# Patient Record
Sex: Male | Born: 1955 | Race: White | Hispanic: No | Marital: Married | Smoking: Never smoker
Health system: Southern US, Community
[De-identification: ages and names within clinical notes are randomized; demographics above are authoritative.]

## PROBLEM LIST (undated history)

## (undated) DIAGNOSIS — R590 Localized enlarged lymph nodes: Secondary | ICD-10-CM

## (undated) DIAGNOSIS — J309 Allergic rhinitis, unspecified: Secondary | ICD-10-CM

## (undated) DIAGNOSIS — Z85820 Personal history of malignant melanoma of skin: Secondary | ICD-10-CM

## (undated) DIAGNOSIS — L309 Dermatitis, unspecified: Secondary | ICD-10-CM

## (undated) DIAGNOSIS — Z9889 Other specified postprocedural states: Secondary | ICD-10-CM

## (undated) DIAGNOSIS — C801 Malignant (primary) neoplasm, unspecified: Secondary | ICD-10-CM

## (undated) DIAGNOSIS — N1330 Unspecified hydronephrosis: Secondary | ICD-10-CM

## (undated) DIAGNOSIS — C8330 Diffuse large B-cell lymphoma, unspecified site: Secondary | ICD-10-CM

## (undated) HISTORY — DX: Diffuse large B-cell lymphoma, unspecified site: C83.30

## (undated) HISTORY — DX: Dermatitis, unspecified: L30.9

## (undated) HISTORY — DX: Malignant (primary) neoplasm, unspecified: C80.1

## (undated) HISTORY — DX: Allergic rhinitis, unspecified: J30.9

---

## 2000-11-07 ENCOUNTER — Other Ambulatory Visit: Admission: RE | Admit: 2000-11-07 | Discharge: 2000-11-07 | Payer: Self-pay | Admitting: Family Medicine

## 2001-02-21 ENCOUNTER — Ambulatory Visit (HOSPITAL_COMMUNITY): Admission: RE | Admit: 2001-02-21 | Discharge: 2001-02-21 | Payer: Self-pay | Admitting: Family Medicine

## 2001-02-21 ENCOUNTER — Encounter: Payer: Self-pay | Admitting: Family Medicine

## 2001-09-14 ENCOUNTER — Encounter: Payer: Self-pay | Admitting: Internal Medicine

## 2001-09-14 ENCOUNTER — Emergency Department (HOSPITAL_COMMUNITY): Admission: EM | Admit: 2001-09-14 | Discharge: 2001-09-14 | Payer: Self-pay | Admitting: Internal Medicine

## 2002-09-08 ENCOUNTER — Emergency Department (HOSPITAL_COMMUNITY): Admission: EM | Admit: 2002-09-08 | Discharge: 2002-09-08 | Payer: Self-pay | Admitting: Emergency Medicine

## 2002-09-08 ENCOUNTER — Encounter: Payer: Self-pay | Admitting: Emergency Medicine

## 2004-09-03 ENCOUNTER — Ambulatory Visit (HOSPITAL_COMMUNITY): Admission: RE | Admit: 2004-09-03 | Discharge: 2004-09-03 | Payer: Self-pay | Admitting: Family Medicine

## 2004-09-04 ENCOUNTER — Ambulatory Visit (HOSPITAL_COMMUNITY): Admission: RE | Admit: 2004-09-04 | Discharge: 2004-09-04 | Payer: Self-pay | Admitting: Family Medicine

## 2005-07-15 ENCOUNTER — Encounter: Admission: RE | Admit: 2005-07-15 | Discharge: 2005-07-15 | Payer: Self-pay | Admitting: Thoracic Surgery

## 2005-07-21 ENCOUNTER — Encounter: Admission: RE | Admit: 2005-07-21 | Discharge: 2005-07-21 | Payer: Self-pay | Admitting: Thoracic Surgery

## 2005-08-18 ENCOUNTER — Encounter: Admission: RE | Admit: 2005-08-18 | Discharge: 2005-08-18 | Payer: Self-pay | Admitting: Thoracic Surgery

## 2005-12-22 ENCOUNTER — Encounter: Admission: RE | Admit: 2005-12-22 | Discharge: 2005-12-22 | Payer: Self-pay | Admitting: Thoracic Surgery

## 2006-01-14 ENCOUNTER — Ambulatory Visit: Payer: Self-pay | Admitting: Cardiology

## 2006-05-25 ENCOUNTER — Ambulatory Visit (HOSPITAL_COMMUNITY): Admission: RE | Admit: 2006-05-25 | Discharge: 2006-05-25 | Payer: Self-pay | Admitting: Otolaryngology

## 2008-07-05 HISTORY — PX: COLONOSCOPY: SHX5424

## 2009-04-18 ENCOUNTER — Encounter: Payer: Self-pay | Admitting: Internal Medicine

## 2009-05-09 ENCOUNTER — Ambulatory Visit: Payer: Self-pay | Admitting: Internal Medicine

## 2009-05-09 ENCOUNTER — Ambulatory Visit (HOSPITAL_COMMUNITY): Admission: RE | Admit: 2009-05-09 | Discharge: 2009-05-09 | Payer: Self-pay | Admitting: Internal Medicine

## 2010-07-26 ENCOUNTER — Encounter: Payer: Self-pay | Admitting: Thoracic Surgery

## 2012-11-08 ENCOUNTER — Telehealth: Payer: Self-pay | Admitting: Family Medicine

## 2012-11-08 ENCOUNTER — Encounter: Payer: Self-pay | Admitting: *Deleted

## 2012-11-08 NOTE — Telephone Encounter (Signed)
Pt was seen in the last few months for a sinus infection and was given 2 prescriptions to clear up that infection then. He is having the same symptoms again and wants to know if he can get another script to help with this infection or does he need to come in for a visit. Please call into [facility] if possible or call to make an appt.

## 2012-11-08 NOTE — Telephone Encounter (Signed)
He will need to come in for an office visit. Typically one week call in antibiotics for a patient it's when we had just seen them fair he recently for the same illness and it did not clear up with the first medicine.

## 2012-11-08 NOTE — Telephone Encounter (Signed)
Appointment made for Nov 09, 2012 @ 8:30am with Dr. Brett Canales for Sinus issues

## 2012-11-08 NOTE — Telephone Encounter (Signed)
Patient notified and scheduled office visit. 

## 2012-11-09 ENCOUNTER — Encounter: Payer: Self-pay | Admitting: Family Medicine

## 2012-11-09 ENCOUNTER — Ambulatory Visit (INDEPENDENT_AMBULATORY_CARE_PROVIDER_SITE_OTHER): Payer: 59 | Admitting: Family Medicine

## 2012-11-09 VITALS — BP 114/80 | Temp 97.7°F | Wt 229.0 lb

## 2012-11-09 DIAGNOSIS — J322 Chronic ethmoidal sinusitis: Secondary | ICD-10-CM

## 2012-11-09 MED ORDER — DOXYCYCLINE HYCLATE 100 MG PO TABS: 100.0000 mg | ORAL_TABLET | Freq: Two times a day (BID) | ORAL | Status: DC

## 2012-11-09 MED ORDER — FLUTICASONE PROPIONATE 50 MCG/ACT NA SUSP: NASAL | Status: DC

## 2012-11-09 MED ORDER — CETIRIZINE HCL 10 MG PO TABS: 10.0000 mg | ORAL_TABLET | Freq: Every day | ORAL | Status: DC

## 2012-11-09 NOTE — Patient Instructions (Signed)
olecranon bursitis

## 2012-11-09 NOTE — Progress Notes (Signed)
  Subjective:    Patient ID: John Marquez, male    DOB: [birth year], 57 y.o.   MRN: [id number]  Sinusitis This is a new problem. The current episode started in the past 7 days. The problem has been gradually worsening since onset. There has been no fever. The fever has been present for less than 1 day. His pain is at a severity of 3/10. Associated symptoms include congestion, coughing, headaches, sinus pressure and sneezing. Past treatments include nothing. The treatment provided mild relief.    Patient a bit frustrated by frequency of sinusitis the past couple years. Has seasonal allergies. Uses Zyrtec and Flonase when necessary.  Review of Systems  HENT: Positive for congestion, sneezing and sinus pressure.   Respiratory: Positive for cough.   Neurological: Positive for headaches.       Objective:   Physical Exam Alert no acute distress. Vitals reviewed. HEENT moderate nasal congestion. Some maxillary tenderness. Pharynx slight erythema neck supple. Lungs clear. Heart regular in rhythm.       Assessment & Plan:  Impression acute sinusitis. #2 allergic rhinitis discussed. Plan antibiotics prescribed. Increase Flonase to 2 sprays each nostril daily. On the way out the door the patient point towards his elbow, and asked what was going on, and I advised him he has an element of olecranon bursitis.

## 2013-04-30 ENCOUNTER — Telehealth: Payer: Self-pay | Admitting: Family Medicine

## 2013-04-30 DIAGNOSIS — R7301 Impaired fasting glucose: Secondary | ICD-10-CM

## 2013-04-30 DIAGNOSIS — Z125 Encounter for screening for malignant neoplasm of prostate: Secondary | ICD-10-CM

## 2013-04-30 DIAGNOSIS — Z0000 Encounter for general adult medical examination without abnormal findings: Secondary | ICD-10-CM

## 2013-04-30 NOTE — Telephone Encounter (Signed)
Lipid/liv/met7/HgA1C/PSA

## 2013-04-30 NOTE — Telephone Encounter (Signed)
Blood work ordered in Epic. Patient notified. 

## 2013-04-30 NOTE — Telephone Encounter (Signed)
BW papers please

## 2013-05-01 LAB — LIPID PANEL
HDL: 52 mg/dL (ref >39)
Total CHOL/HDL Ratio: 2.7 Ratio
VLDL: 17 mg/dL (ref 0–40)

## 2013-05-01 LAB — BASIC METABOLIC PANEL
Creat: 1.01 mg/dL (ref 0.50–1.35)
Glucose, Bld: 101 mg/dL — ABNORMAL HIGH (ref 70–99)
Sodium: 139 mEq/L (ref 135–145)

## 2013-05-01 LAB — HEPATIC FUNCTION PANEL
AST: 24 U/L (ref 0–37)
Albumin: 4.5 g/dL (ref 3.5–5.2)
Alkaline Phosphatase: 60 U/L (ref 39–117)
Total Bilirubin: 0.6 mg/dL (ref 0.3–1.2)
Total Protein: 6.7 g/dL (ref 6.0–8.3)

## 2013-05-02 ENCOUNTER — Encounter: Payer: Self-pay | Admitting: Family Medicine

## 2013-05-14 ENCOUNTER — Ambulatory Visit (INDEPENDENT_AMBULATORY_CARE_PROVIDER_SITE_OTHER): Payer: 59 | Admitting: Family Medicine

## 2013-05-14 ENCOUNTER — Encounter: Payer: Self-pay | Admitting: Family Medicine

## 2013-05-14 DIAGNOSIS — Z0000 Encounter for general adult medical examination without abnormal findings: Secondary | ICD-10-CM

## 2013-05-14 DIAGNOSIS — Z23 Encounter for immunization: Secondary | ICD-10-CM

## 2013-05-14 DIAGNOSIS — R7303 Prediabetes: Secondary | ICD-10-CM | POA: Insufficient documentation

## 2013-05-14 DIAGNOSIS — R7309 Other abnormal glucose: Secondary | ICD-10-CM

## 2013-05-14 NOTE — Progress Notes (Signed)
  Subjective:    Patient ID: John Marquez, male    DOB: 1955-11-01, 57 y.o.   MRN: [id number]  HPIHere for physical. Declined flu vaccine.  Patient denies any chest tightness pressure pain shortness breath he does try to be safe he is trying to stay physically active he denies any other particular problems   Review of Systems  Constitutional: Negative for fever, activity change and appetite change.  HENT: Negative for congestion and rhinorrhea.   Eyes: Negative for discharge.  Respiratory: Negative for cough and wheezing.   Cardiovascular: Negative for chest pain.  Gastrointestinal: Negative for vomiting, abdominal pain and blood in stool.  Genitourinary: Negative for frequency and difficulty urinating.  Musculoskeletal: Negative for neck pain.  Skin: Negative for rash.  Allergic/Immunologic: Negative for environmental allergies and food allergies.  Neurological: Negative for weakness and headaches.  Psychiatric/Behavioral: Negative for agitation.       Objective:   Physical Exam  Constitutional: He appears well-developed and well-nourished.  HENT:  Head: Normocephalic and atraumatic.  Right Ear: External ear normal.  Left Ear: External ear normal.  Nose: Nose normal.  Mouth/Throat: Oropharynx is clear and moist.  Eyes: EOM are normal. Pupils are equal, round, and reactive to light.  Neck: Normal range of motion. Neck supple. No thyromegaly present.  Cardiovascular: Normal rate, regular rhythm and normal heart sounds.   No murmur heard. Pulmonary/Chest: Effort normal and breath sounds normal. No respiratory distress. He has no wheezes.  Abdominal: Soft. Bowel sounds are normal. He exhibits no distension and no mass. There is no tenderness.  Genitourinary: Penis normal.  Musculoskeletal: Normal range of motion. He exhibits no edema.  Lymphadenopathy:    He has no cervical adenopathy.  Neurological: He is alert. He exhibits normal muscle tone.  Skin: Skin is warm and dry. No  erythema.  Psychiatric: He has a normal mood and affect. His behavior is normal. Judgment normal.          Assessment & Plan:  The patient comes in today for a wellness visit.  A review of their health history was completed.  A review of medications was also completed. Any necessary refills were discussed. Sensible healthy diet was discussed. Importance of minimizing excessive salt and carbohydrates was also discussed. Safety was stressed including driving, activities at work and at home where applicable. Importance of regular physical activity for overall health was discussed. Preventative measures appropriate for age were discussed. Time was spent with the patient discussing any concerns they have about their well-being. The importance of minimizing starches were discussed in detail. Safety measures also discussed. Patient will followup in one years time I did offer to check hemoglobin A1c through our office in 6 months  Lab work was reviewed the patient in detail. Does show some prediabetes but otherwise it is doing very well.

## 2013-11-13 ENCOUNTER — Ambulatory Visit (INDEPENDENT_AMBULATORY_CARE_PROVIDER_SITE_OTHER): Payer: 59 | Admitting: Family Medicine

## 2013-11-13 ENCOUNTER — Encounter: Payer: Self-pay | Admitting: Family Medicine

## 2013-11-13 VITALS — BP 118/78 | Temp 98.2°F | Ht 73.0 in | Wt 233.0 lb

## 2013-11-13 DIAGNOSIS — J019 Acute sinusitis, unspecified: Secondary | ICD-10-CM

## 2013-11-13 MED ORDER — CEFPROZIL 500 MG PO TABS: 500.0000 mg | ORAL_TABLET | Freq: Two times a day (BID) | ORAL | Status: DC

## 2013-11-13 MED ORDER — CETIRIZINE HCL 10 MG PO TABS: 10.0000 mg | ORAL_TABLET | Freq: Every day | ORAL | Status: DC

## 2013-11-13 MED ORDER — FLUTICASONE PROPIONATE 50 MCG/ACT NA SUSP: NASAL | Status: DC

## 2013-11-13 NOTE — Progress Notes (Signed)
   Subjective:    Patient ID: John Marquez, male    DOB: [birth year], 58 y.o.   MRN: [id number]  Cough This is a new problem. The current episode started in the past 7 days. Associated symptoms include nasal congestion, rhinorrhea and a sore throat. Pertinent negatives include no chest pain, ear pain, fever, shortness of breath or wheezing. Treatments tried: flonase, allergy med. The treatment provided no relief.   Throat pain first then cough Coughs with deep breath   Review of Systems  Constitutional: Negative for fever and activity change.  HENT: Positive for congestion, rhinorrhea and sore throat. Negative for ear pain.   Eyes: Negative for discharge.  Respiratory: Positive for cough. Negative for shortness of breath and wheezing.   Cardiovascular: Negative for chest pain.  Gastrointestinal: Negative for abdominal pain.       Objective:   Physical Exam  Nursing note and vitals reviewed. Constitutional: He appears well-developed.  HENT:  Head: Normocephalic.  Mouth/Throat: Oropharynx is clear and moist. No oropharyngeal exudate.  Neck: Normal range of motion.  Cardiovascular: Normal rate, regular rhythm and normal heart sounds.   No murmur heard. Pulmonary/Chest: Effort normal and breath sounds normal. He has no wheezes.  Bronchial cough noted  Lymphadenopathy:    He has no cervical adenopathy.  Neurological: He exhibits normal muscle tone.  Skin: Skin is warm and dry.          Assessment & Plan:  1. Acute sinus infection Antibiotics prescribed Cefzil twice a day for 2 weeks patient has taken these before and tolerated them. He also has left elbow pain he'll be seen orthopedic specialist possible nerve involvement he will set this appointment up.

## 2013-11-16 ENCOUNTER — Telehealth: Payer: Self-pay | Admitting: Family Medicine

## 2013-11-16 MED ORDER — SCOPOLAMINE 1 MG/3DAYS TD PT72: 1.0000 | MEDICATED_PATCH | TRANSDERMAL | Status: DC

## 2013-11-16 NOTE — Telephone Encounter (Signed)
Scopolamine patches use one q3d as needed, can cause blurry vision and occasional difficulty urinating, if issues with this then stop med

## 2013-11-16 NOTE — Telephone Encounter (Signed)
Patient would like prescription for motion sickness patches called in. He is going on vacation next week fishing. [place]

## 2013-11-16 NOTE — Addendum Note (Signed)
Addended byCharolotte Capuchin D on: 11/16/2013 04:38 PM   Modules accepted: Orders

## 2013-11-16 NOTE — Telephone Encounter (Signed)
Patient notified and verbalized understanding. 

## 2013-11-22 ENCOUNTER — Telehealth: Payer: Self-pay | Admitting: Family Medicine

## 2013-11-22 MED ORDER — CLINDAMYCIN HCL 300 MG PO CAPS: 300.0000 mg | ORAL_CAPSULE | Freq: Three times a day (TID) | ORAL | Status: DC

## 2013-11-22 NOTE — Telephone Encounter (Signed)
Left message on voicemail notifying patient med sent in.  

## 2013-11-22 NOTE — Telephone Encounter (Signed)
Patient was seen 5/12 with sinus infection and finished ed up antibotics but still sick. Can you call in something else ,states he is leaving going on vacation tomorrow. Call into [place] [facility]

## 2013-11-22 NOTE — Telephone Encounter (Signed)
Clindamycin 300 , 1 tid 7 days, also take probiotic daily

## 2013-11-22 NOTE — Addendum Note (Signed)
Addended by: Jesusita Oka on: 11/22/2013 11:27 AM   Modules accepted: Orders

## 2013-11-22 NOTE — Telephone Encounter (Signed)
Was given cefzil 500 mg

## 2014-03-13 ENCOUNTER — Ambulatory Visit (INDEPENDENT_AMBULATORY_CARE_PROVIDER_SITE_OTHER): Payer: 59 | Admitting: Family Medicine

## 2014-03-13 ENCOUNTER — Encounter: Payer: Self-pay | Admitting: Family Medicine

## 2014-03-13 VITALS — BP 124/80 | Temp 98.2°F | Ht 73.0 in | Wt 229.0 lb

## 2014-03-13 DIAGNOSIS — J329 Chronic sinusitis, unspecified: Secondary | ICD-10-CM

## 2014-03-13 MED ORDER — CLINDAMYCIN HCL 300 MG PO CAPS: 300.0000 mg | ORAL_CAPSULE | Freq: Three times a day (TID) | ORAL | Status: DC

## 2014-03-13 NOTE — Progress Notes (Signed)
   Subjective:    Patient ID: John Marquez, male    DOB: June 21, 1956, 58 y.o.   MRN: [id number]  Cough This is a new problem. The current episode started in the past 7 days. Associated symptoms include headaches, nasal congestion and a sore throat. Associated symptoms comments: Sneezing, Runny nose. He has tried nothing for the symptoms.    Frontal heaache  Cough not that productive  achey no meds other thn zyrtec prn  Frontal sinusitis  Throat in the morn, first thing in morn    Review of Systems  HENT: Positive for sore throat.   Respiratory: Positive for cough.   Neurological: Positive for headaches.        Objective:   Physical Exam  Alert vital signs stable. Lungs clear. Heart regular in rhythm for him frontal tenderness maxillary tenderness neck supple      Assessment & Plan:  Impression sinusitis discussed plan symptomatic care discussed. Antibiotics prescribed. Warning signs discussed. WSL

## 2014-04-19 ENCOUNTER — Telehealth: Payer: Self-pay | Admitting: Family Medicine

## 2014-04-19 DIAGNOSIS — R7303 Prediabetes: Secondary | ICD-10-CM

## 2014-04-19 DIAGNOSIS — Z79899 Other long term (current) drug therapy: Secondary | ICD-10-CM

## 2014-04-19 DIAGNOSIS — E785 Hyperlipidemia, unspecified: Secondary | ICD-10-CM

## 2014-04-19 DIAGNOSIS — Z125 Encounter for screening for malignant neoplasm of prostate: Secondary | ICD-10-CM

## 2014-04-19 NOTE — Telephone Encounter (Signed)
Last labs 04/30/13 Lipid, liver, BMP, A1C, PSA

## 2014-04-19 NOTE — Telephone Encounter (Signed)
Patient needs order for blood work. °

## 2014-04-21 NOTE — Telephone Encounter (Signed)
Same labs? 

## 2014-04-22 NOTE — Telephone Encounter (Signed)
Blood work ordered in Epic. Patient notified. 

## 2014-04-24 LAB — HEPATIC FUNCTION PANEL
ALT: 33 U/L (ref 0–53)
AST: 25 U/L (ref 0–37)
Albumin: 4.2 g/dL (ref 3.5–5.2)
Alkaline Phosphatase: 53 U/L (ref 39–117)
BILIRUBIN INDIRECT: 0.5 mg/dL (ref 0.2–1.2)
BILIRUBIN TOTAL: 0.7 mg/dL (ref 0.2–1.2)
Bilirubin, Direct: 0.2 mg/dL (ref 0.0–0.3)
Total Protein: 6.6 g/dL (ref 6.0–8.3)

## 2014-04-24 LAB — LIPID PANEL
CHOL/HDL RATIO: 2.6 ratio
Cholesterol: 131 mg/dL (ref 0–200)
HDL: 50 mg/dL (ref >39)
LDL CALC: 71 mg/dL (ref 0–99)
TRIGLYCERIDES: 52 mg/dL (ref <150)
VLDL: 10 mg/dL (ref 0–40)

## 2014-04-24 LAB — BASIC METABOLIC PANEL
BUN: 17 mg/dL (ref 6–23)
CO2: 27 meq/L (ref 19–32)
CREATININE: 1.17 mg/dL (ref 0.50–1.35)
Calcium: 9.2 mg/dL (ref 8.4–10.5)
Chloride: 105 mEq/L (ref 96–112)
GLUCOSE: 85 mg/dL (ref 70–99)
Potassium: 4.8 mEq/L (ref 3.5–5.3)
Sodium: 139 mEq/L (ref 135–145)

## 2014-04-24 LAB — HEMOGLOBIN A1C
Hgb A1c MFr Bld: 5.7 % — ABNORMAL HIGH (ref <5.7)
MEAN PLASMA GLUCOSE: 117 mg/dL — AB (ref <117)

## 2014-04-25 LAB — PSA: PSA: 0.84 ng/mL (ref <=4.00)

## 2014-05-16 ENCOUNTER — Encounter: Payer: 59 | Admitting: Family Medicine

## 2014-05-22 ENCOUNTER — Encounter: Payer: Self-pay | Admitting: Family Medicine

## 2014-05-22 ENCOUNTER — Ambulatory Visit (INDEPENDENT_AMBULATORY_CARE_PROVIDER_SITE_OTHER): Payer: 59 | Admitting: Family Medicine

## 2014-05-22 VITALS — BP 118/70 | Ht 73.0 in | Wt 231.0 lb

## 2014-05-22 DIAGNOSIS — R7303 Prediabetes: Secondary | ICD-10-CM

## 2014-05-22 DIAGNOSIS — R7309 Other abnormal glucose: Secondary | ICD-10-CM

## 2014-05-22 DIAGNOSIS — Z23 Encounter for immunization: Secondary | ICD-10-CM

## 2014-05-22 NOTE — Progress Notes (Signed)
   Subjective:    Patient ID: John Marquez, male    DOB: [birth year], 58 y.o.   MRN: [id number]  Hyperglycemia Pertinent negatives include no abdominal pain, chest pain, congestion, coughing, fatigue or weakness.   Patient is here today to go over blood work results.   Patient is trying to eat better and exercise more for his blood sugar.  Long discussion held regarding prediabetes hyperlipidemia watching diet blood pressure No concerns.    Review of Systems  Constitutional: Negative for activity change, appetite change and fatigue.  HENT: Negative for congestion.   Respiratory: Negative for cough.   Cardiovascular: Negative for chest pain.  Gastrointestinal: Negative for abdominal pain.  Endocrine: Negative for polydipsia and polyphagia.  Neurological: Negative for weakness.  Psychiatric/Behavioral: Negative for confusion.       Objective:   Physical Exam  Constitutional: He appears well-nourished. No distress.  Cardiovascular: Normal rate, regular rhythm and normal heart sounds.   No murmur heard. Pulmonary/Chest: Effort normal and breath sounds normal. No respiratory distress.  Musculoskeletal: He exhibits no edema.  Lymphadenopathy:    He has no cervical adenopathy.  Neurological: He is alert.  Psychiatric: His behavior is normal.  Vitals reviewed.  Prostate exam normal       Assessment & Plan:  Labs were reviewed in detail. Prediabetes watch diet closely Healthy eating regular physical activity recommended Wellness exam next year Patient up-to-date on everything for this year

## 2014-11-20 ENCOUNTER — Ambulatory Visit (INDEPENDENT_AMBULATORY_CARE_PROVIDER_SITE_OTHER): Payer: 59 | Admitting: Family Medicine

## 2014-11-20 ENCOUNTER — Encounter: Payer: Self-pay | Admitting: Family Medicine

## 2014-11-20 VITALS — BP 120/80 | Temp 98.0°F | Ht 73.0 in | Wt 227.0 lb

## 2014-11-20 DIAGNOSIS — J301 Allergic rhinitis due to pollen: Secondary | ICD-10-CM | POA: Diagnosis not present

## 2014-11-20 DIAGNOSIS — T162XXA Foreign body in left ear, initial encounter: Secondary | ICD-10-CM | POA: Diagnosis not present

## 2014-11-20 MED ORDER — CETIRIZINE HCL 10 MG PO TABS: 10.0000 mg | ORAL_TABLET | Freq: Every day | ORAL | Status: DC

## 2014-11-20 MED ORDER — FLUTICASONE PROPIONATE 50 MCG/ACT NA SUSP: NASAL | Status: DC

## 2014-11-20 NOTE — Progress Notes (Signed)
   Subjective:    Patient ID: John Marquez, male    DOB: [birth year], 59 y.o.   MRN: [id number]  HPI Patient in today because he had his ears irrigated at a [facility] yesterday and he now has a piece of his hearing aid stuck in his left ear. No pain but pressure is noted.  Patient also needs refills on his meds while he is here.   Review of Systems     Objective:   Physical Exam  Lungs clear hearts regular eardrums there is a foreign body in the left ear. Right ear appears normal. It appears that the tip of one of his hearing aids came off in his ear     Assessment & Plan:  Allergic rhinitis renewal of medicines given  Foreign body left ear. Needs urgent referral to ENT to have this removed.  He will follow-up this fall for wellness

## 2014-11-21 ENCOUNTER — Ambulatory Visit (INDEPENDENT_AMBULATORY_CARE_PROVIDER_SITE_OTHER): Payer: 59 | Admitting: Otolaryngology

## 2014-11-21 DIAGNOSIS — T162XXA Foreign body in left ear, initial encounter: Secondary | ICD-10-CM

## 2015-02-14 LAB — PULMONARY FUNCTION TEST

## 2015-03-24 ENCOUNTER — Encounter: Payer: Self-pay | Admitting: Family Medicine

## 2015-03-24 ENCOUNTER — Ambulatory Visit (INDEPENDENT_AMBULATORY_CARE_PROVIDER_SITE_OTHER): Payer: 59 | Admitting: Family Medicine

## 2015-03-24 VITALS — BP 130/80 | Temp 98.2°F | Ht 73.0 in | Wt 230.0 lb

## 2015-03-24 DIAGNOSIS — H00036 Abscess of eyelid left eye, unspecified eyelid: Secondary | ICD-10-CM | POA: Diagnosis not present

## 2015-03-24 DIAGNOSIS — J301 Allergic rhinitis due to pollen: Secondary | ICD-10-CM | POA: Diagnosis not present

## 2015-03-24 MED ORDER — OLOPATADINE HCL 0.2 % OP SOLN: 1.0000 [drp] | Freq: Every evening | OPHTHALMIC | Status: DC | PRN

## 2015-03-24 MED ORDER — CEFPROZIL 500 MG PO TABS: 500.0000 mg | ORAL_TABLET | Freq: Two times a day (BID) | ORAL | Status: DC

## 2015-03-24 NOTE — Progress Notes (Signed)
   Subjective:    Patient ID: John Marquez, male    DOB: [birth year], 59 y.o.   MRN: [id number]  Eye Pain  The left eye is affected. This is a new problem. The current episode started in the past 7 days. The problem occurs constantly. The problem has been unchanged. There was no injury mechanism. The pain is moderate. Associated symptoms include blurred vision and eye redness. Associated symptoms comments: Light sensitivity . Treatments tried: cetirizine, flonase. The treatment provided no relief.   Patient states that he has no other concerns at this time.    Review of Systems  Eyes: Positive for blurred vision, pain and redness.   Patient denies vomiting diarrhea. He states that the left eye has some irritation to it no true double vision. Relates head congestion sinus issues and allergies.    Objective:   Physical Exam  Left eyelid is puffy slightly red some tenderness throat normal ears normal neck no masses lungs clear heart regular  The pupils are responsive to light red reflex is normal optic disc appearance normal does not appear to have any iritis or red eye    Assessment & Plan:  Mild preorbital cellulitis with a thickened allergies-antibiotics allergy medicine recommended. Follow-up if progressive troubles, if not improving over the next 3 days or getting worse recommend referral to ophthalmology

## 2015-03-25 ENCOUNTER — Other Ambulatory Visit: Payer: Self-pay

## 2015-03-25 MED ORDER — OLOPATADINE HCL 0.1 % OP SOLN: OPHTHALMIC | Status: DC

## 2015-04-21 ENCOUNTER — Telehealth: Payer: Self-pay | Admitting: Family Medicine

## 2015-04-21 DIAGNOSIS — R7303 Prediabetes: Secondary | ICD-10-CM

## 2015-04-21 DIAGNOSIS — Z79899 Other long term (current) drug therapy: Secondary | ICD-10-CM

## 2015-04-21 DIAGNOSIS — Z125 Encounter for screening for malignant neoplasm of prostate: Secondary | ICD-10-CM

## 2015-04-21 DIAGNOSIS — R5383 Other fatigue: Secondary | ICD-10-CM

## 2015-04-21 DIAGNOSIS — Z13220 Encounter for screening for lipoid disorders: Secondary | ICD-10-CM

## 2015-04-21 NOTE — Telephone Encounter (Signed)
Please do these labs and also add CBC

## 2015-04-21 NOTE — Telephone Encounter (Signed)
Patient requesting labs to have done before office visit. Last labs ordered on 04/24/2014 were: Lipid, Hepatic, PSA, BMET, Hemoglobin A1C

## 2015-04-21 NOTE — Addendum Note (Signed)
Addended by: Carmelina Noun on: 04/21/2015 11:43 AM   Modules accepted: Orders

## 2015-04-21 NOTE — Telephone Encounter (Signed)
Orders ready. Pt notified on vm

## 2015-04-22 ENCOUNTER — Ambulatory Visit (INDEPENDENT_AMBULATORY_CARE_PROVIDER_SITE_OTHER): Payer: 59 | Admitting: Family Medicine

## 2015-04-22 ENCOUNTER — Encounter: Payer: Self-pay | Admitting: Family Medicine

## 2015-04-22 VITALS — BP 122/80 | Ht 73.0 in | Wt 224.2 lb

## 2015-04-22 DIAGNOSIS — Z0000 Encounter for general adult medical examination without abnormal findings: Secondary | ICD-10-CM

## 2015-04-22 DIAGNOSIS — Z23 Encounter for immunization: Secondary | ICD-10-CM

## 2015-04-22 NOTE — Progress Notes (Signed)
   Subjective:    Patient ID: John Marquez, male    DOB: [birth year], 59 y.o.   MRN: [id number]  HPI The patient comes in today for a wellness visit.    A review of their health history was completed.  A review of medications was also completed.  Any needed refills: none   Eating habits: good   Falls/  MVA accidents in past few months: none  Regular exercise: yes daily  Specialist pt sees on regular basis: dermatologist  Preventative health issues were discussed.   Additional concerns: Questions about a condition.  Asbestos Hip Pain   Review of Systems  Constitutional: Negative for activity change, appetite change and fatigue.  HENT: Negative for congestion.   Respiratory: Negative for cough.   Cardiovascular: Negative for chest pain.  Gastrointestinal: Negative for abdominal pain.  Endocrine: Negative for polydipsia and polyphagia.  Neurological: Negative for weakness.  Psychiatric/Behavioral: Negative for confusion.       Objective:   Physical Exam  Constitutional: He appears well-nourished. No distress.  Cardiovascular: Normal rate, regular rhythm and normal heart sounds.   No murmur heard. Pulmonary/Chest: Effort normal and breath sounds normal. No respiratory distress.  Musculoskeletal: He exhibits no edema.  Lymphadenopathy:    He has no cervical adenopathy.  Neurological: He is alert.  Skin: Skin is warm and dry.  Psychiatric: His behavior is normal.  Vitals reviewed.  Patient has incidental hip pain intermittently in the right hip when it becomes more severe I would recommend x-rays. Only occurs occasionally current       Assessment & Plan:  Overall health good. Healthy eating recommended Recommend regular physical activity Lab work recommended. Await results He states that he's can bring the information from his workplace. Apparently got diagnosed with [place] best this changes on chest x-ray. May need further testing await the paper to review  this.

## 2015-04-23 LAB — CBC WITH DIFFERENTIAL/PLATELET
BASOS ABS: 0 10*3/uL (ref 0.0–0.2)
Basos: 1 %
EOS (ABSOLUTE): 0.1 10*3/uL (ref 0.0–0.4)
EOS: 2 %
HEMATOCRIT: 46 % (ref 37.5–51.0)
Hemoglobin: 15.7 g/dL (ref 12.6–17.7)
IMMATURE GRANULOCYTES: 1 %
Immature Grans (Abs): 0 10*3/uL (ref 0.0–0.1)
LYMPHS ABS: 1.2 10*3/uL (ref 0.7–3.1)
Lymphs: 22 %
MCH: 29.1 pg (ref 26.6–33.0)
MCHC: 34.1 g/dL (ref 31.5–35.7)
MCV: 85 fL (ref 79–97)
MONOS ABS: 0.6 10*3/uL (ref 0.1–0.9)
Monocytes: 11 %
NEUTROS PCT: 63 %
Neutrophils Absolute: 3.6 10*3/uL (ref 1.4–7.0)
PLATELETS: 285 10*3/uL (ref 150–379)
RBC: 5.39 x10E6/uL (ref 4.14–5.80)
RDW: 14.1 % (ref 12.3–15.4)
WBC: 5.6 10*3/uL (ref 3.4–10.8)

## 2015-04-23 LAB — HEPATIC FUNCTION PANEL
ALBUMIN: 4.6 g/dL (ref 3.5–5.5)
ALK PHOS: 72 IU/L (ref 39–117)
ALT: 33 IU/L (ref 0–44)
AST: 26 IU/L (ref 0–40)
BILIRUBIN TOTAL: 0.6 mg/dL (ref 0.0–1.2)
BILIRUBIN, DIRECT: 0.17 mg/dL (ref 0.00–0.40)
TOTAL PROTEIN: 6.8 g/dL (ref 6.0–8.5)

## 2015-04-23 LAB — LIPID PANEL
CHOL/HDL RATIO: 2.7 ratio (ref 0.0–5.0)
Cholesterol, Total: 156 mg/dL (ref 100–199)
HDL: 57 mg/dL (ref >39)
LDL Calculated: 85 mg/dL (ref 0–99)
Triglycerides: 69 mg/dL (ref 0–149)
VLDL Cholesterol Cal: 14 mg/dL (ref 5–40)

## 2015-04-23 LAB — BASIC METABOLIC PANEL
BUN / CREAT RATIO: 15 (ref 9–20)
BUN: 16 mg/dL (ref 6–24)
CHLORIDE: 100 mmol/L (ref 97–106)
CO2: 24 mmol/L (ref 18–29)
Calcium: 9.6 mg/dL (ref 8.7–10.2)
Creatinine, Ser: 1.08 mg/dL (ref 0.76–1.27)
GFR calc non Af Amer: 76 mL/min/{1.73_m2} (ref >59)
GFR, EST AFRICAN AMERICAN: 88 mL/min/{1.73_m2} (ref >59)
GLUCOSE: 99 mg/dL (ref 65–99)
POTASSIUM: 4.3 mmol/L (ref 3.5–5.2)
SODIUM: 139 mmol/L (ref 136–144)

## 2015-04-23 LAB — PSA: PROSTATE SPECIFIC AG, SERUM: 2.1 ng/mL (ref 0.0–4.0)

## 2015-04-23 LAB — HEMOGLOBIN A1C
ESTIMATED AVERAGE GLUCOSE: 117 mg/dL
HEMOGLOBIN A1C: 5.7 % — AB (ref 4.8–5.6)

## 2015-04-24 ENCOUNTER — Telehealth: Payer: Self-pay | Admitting: Family Medicine

## 2015-04-24 DIAGNOSIS — J61 Pneumoconiosis due to asbestos and other mineral fibers: Secondary | ICD-10-CM

## 2015-04-24 NOTE — Telephone Encounter (Signed)
Pt dropped some notes off to be reviewed an scanned in for our records  See folder

## 2015-04-30 NOTE — Telephone Encounter (Signed)
Please let me speak with Ronalee Belts

## 2015-05-01 ENCOUNTER — Encounter: Payer: Self-pay | Admitting: Family Medicine

## 2015-05-01 NOTE — Telephone Encounter (Signed)
This patient has asbestosis. I've spoken with him. I recommend office visit with pulmonology/ [facility] for further evaluation. I will forward to [place] the patient's evaluation he had from his workplace. Please put in referral patient prefers afternoon appointment

## 2015-05-01 NOTE — Telephone Encounter (Signed)
Dr. Scott spoke with patient 

## 2015-05-01 NOTE — Telephone Encounter (Signed)
Referral in system

## 2015-05-01 NOTE — Addendum Note (Signed)
Addended by: Jesusita Oka on: 05/01/2015 11:23 AM   Modules accepted: Orders

## 2015-05-22 ENCOUNTER — Encounter: Payer: Self-pay | Admitting: Pulmonary Disease

## 2015-05-22 ENCOUNTER — Ambulatory Visit (INDEPENDENT_AMBULATORY_CARE_PROVIDER_SITE_OTHER): Payer: 59 | Admitting: Pulmonary Disease

## 2015-05-22 VITALS — BP 132/72 | HR 76 | Ht 73.5 in | Wt 233.2 lb

## 2015-05-22 DIAGNOSIS — J61 Pneumoconiosis due to asbestos and other mineral fibers: Secondary | ICD-10-CM | POA: Diagnosis not present

## 2015-05-22 NOTE — Patient Instructions (Signed)
We'll schedule you for a High Res CT of the chest. We will follow closely with annual lung function tests and chest imaging.  Return to clinic in 6 months

## 2015-05-22 NOTE — Progress Notes (Signed)
   Subjective:    Patient ID: John Marquez, male    DOB: 05/07/1956, 59 y.o.   MRN: [id number]  HPI Consult for evaluation and management of asbestosis.  John Marquez is a 59 year old with past medical history of nasal allergies. He worked at [employer] from [employer]. 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 He was sent by his employer to [facility] pulmonary clinic for evaluation where he was diagnosed with asbestosis. This diagnosis was made is on basis of his known exposure and a chest x-ray dated 05/23/14 which is read as opacities in the upper mid and lower lung zones bilaterally. I do not have this imaging to review. He had PFTs this year which did not show any abnormality (see below)  He has history of allergic rhinitis, sinusitis and takes Zyrtec and Flonase. He reports a little bit of dyspnea on exertion that he's noticed over the past few years. However he leads an active lifestyle and exercises 3 times a day with weights and cardio.  Social history: He is a never smoker. He drinks about 2 beers a week. No recreational drug use. Employment history as noted above.  Family history: Emphysema and lung cancer- Father.  PFTs [02/14/15] FVC 4.95 [94%] FEV1 4.21 [203%] F/F 85 TLC 7.28 [96%] DLCO [104%] No obstruction, normal lung volumes and diffusion capacity.    Past Medical History  Diagnosis Date  . Allergic rhinitis   . Eczema     Winter    Current outpatient prescriptions:  .  cetirizine (ZYRTEC) 10 MG tablet, Take 1 tablet (10 mg total) by mouth daily., Disp: 90 tablet, Rfl: 3 .  fluticasone (FLONASE) 50 MCG/ACT nasal spray, Two sprays each nostril daily, Disp: 48 g, Rfl: 3   Review of Systems Complains of mild dyspnea on exertion, no cough, sputum production, wheezing No chest  pain, palpitations No nausea, vomiting, diarrhea, constipation. No fevers, chills, loss of weight or appetite. All other review of systems are negative.    Objective:   Physical Exam  Blood pressure 132/72, pulse 76, height 6' 1.5" (1.867 m), weight 233 lb 3.2 oz (105.779 kg), SpO2 97 %.  Gen: No apparent distress Neuro: No gross focal deficits. Neck: No JVD, lymphadenopathy, thyromegaly. RS: Clear, no wheeze or crackles. CVS: S1-S2 heard, no murmurs rubs gallops. Abdomen: Soft, positive bowel sounds. Extremities: No edema.    Assessment & Plan:  Asbestosis.  He was given a diagnosis of asbestosis based on his significant exposure history and abnormal chest x-ray. Luckily his lung function appears well-preserved on PFTs. He has good exercise capacity with no obvious impairments. We discussed the fact that he may develop progressive lung fibrosis and decline in respiratory function the future. We will need to monitor him in the clinic with annual PFTs and chest imaging.  His active lifestyle with exercise.  I will get a high-resolution CT of the chest to get a baseline evaluation of his lung disease.  Return to clinic in 6 months or sooner if respiratory symptoms develop.  Marshell Garfinkel MD [facility] Pager [phone] If no answer or after 3pm call: [phone] 05/22/2015, 3:06 PM

## 2015-05-27 ENCOUNTER — Ambulatory Visit (INDEPENDENT_AMBULATORY_CARE_PROVIDER_SITE_OTHER)
Admission: RE | Admit: 2015-05-27 | Discharge: 2015-05-27 | Disposition: A | Discharge status: Home / Self Care | Payer: 59 | Source: Ambulatory Visit | Attending: Pulmonary Disease | Admitting: Pulmonary Disease

## 2015-05-27 DIAGNOSIS — J61 Pneumoconiosis due to asbestos and other mineral fibers: Secondary | ICD-10-CM

## 2015-06-02 NOTE — Progress Notes (Signed)
Quick Note:  lmtcb for pt. ______ 

## 2015-06-04 NOTE — Progress Notes (Signed)
Quick Note:  lmtcb for pt. ______ 

## 2015-06-05 NOTE — Progress Notes (Signed)
Quick Note:  LVM for patient to return call. ______ 

## 2015-06-06 ENCOUNTER — Encounter: Payer: Self-pay | Admitting: *Deleted

## 2015-06-09 ENCOUNTER — Telehealth: Payer: Self-pay | Admitting: Pulmonary Disease

## 2015-06-09 NOTE — Telephone Encounter (Signed)
I spoke with patient about results and he verbalized understanding and had no questions 

## 2015-06-09 NOTE — Telephone Encounter (Signed)
Patient returned call, may be reached at [phone]

## 2015-06-09 NOTE — Telephone Encounter (Signed)
Notes Recorded by Marshell Garfinkel, MD on 06/02/2015 at 1:40 PM Please call the patient and let him know that the CT of the chest is normal ---  [id number] x1

## 2015-06-10 ENCOUNTER — Encounter: Payer: Self-pay | Admitting: Pulmonary Disease

## 2015-06-16 DIAGNOSIS — Z0289 Encounter for other administrative examinations: Secondary | ICD-10-CM

## 2015-08-10 ENCOUNTER — Encounter: Payer: Self-pay | Admitting: Family Medicine

## 2015-08-10 DIAGNOSIS — D039 Melanoma in situ, unspecified: Secondary | ICD-10-CM | POA: Insufficient documentation

## 2015-10-02 ENCOUNTER — Other Ambulatory Visit: Payer: Self-pay | Admitting: *Deleted

## 2015-10-02 DIAGNOSIS — Z125 Encounter for screening for malignant neoplasm of prostate: Secondary | ICD-10-CM

## 2015-10-08 LAB — PSA: Prostate Specific Ag, Serum: 1 ng/mL (ref 0.0–4.0)

## 2015-12-05 ENCOUNTER — Ambulatory Visit: Payer: 59 | Admitting: Pulmonary Disease

## 2015-12-12 ENCOUNTER — Encounter: Payer: Self-pay | Admitting: Pulmonary Disease

## 2015-12-12 ENCOUNTER — Ambulatory Visit (INDEPENDENT_AMBULATORY_CARE_PROVIDER_SITE_OTHER): Payer: 59 | Admitting: Pulmonary Disease

## 2015-12-12 ENCOUNTER — Encounter (INDEPENDENT_AMBULATORY_CARE_PROVIDER_SITE_OTHER): Payer: Self-pay

## 2015-12-12 VITALS — BP 128/74 | HR 69 | Ht 73.5 in | Wt 226.0 lb

## 2015-12-12 DIAGNOSIS — R0600 Dyspnea, unspecified: Secondary | ICD-10-CM | POA: Diagnosis not present

## 2015-12-12 NOTE — Progress Notes (Signed)
   Subjective:    Patient ID: John Marquez, male    DOB: 1956/03/02, 60 y.o.   MRN: [id number]  HPI Follow up for evaluation of asbestosis.  John Marquez is a 60 year old with past medical history of nasal allergies. He worked at [employer] from [employer]. 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 He was sent by his employer to [facility] pulmonary clinic for evaluation where he was diagnosed with asbestosis. This diagnosis was made is on basis of his known exposure and a chest x-ray dated 05/23/14 which is read as opacities in the upper mid and lower lung zones bilaterally. I do not have this imaging to review. He had PFTs  and CT which did not show any abnormality (see below)  He has history of allergic rhinitis, sinusitis and takes Zyrtec and Flonase. He reports a little bit of dyspnea on exertion that he's noticed over the past few years. However he leads an active lifestyle and exercises 3 times a day with weights and cardio.  Social history: He is a never smoker. He drinks about 2 beers a week. No recreational drug use. Employment history as noted above.  Family history: Emphysema and lung cancer- Father.  PFTs [02/14/15] FVC 4.95 [94%] FEV1 4.21 [203%] F/F 85 TLC 7.28 [96%] DLCO [104%] No obstruction, normal lung volumes and diffusion capacity.   CT high res 05/27/15 No evidence of asbestosis or interstitial lung disease, minimal nonspecific groundglass opacity in right upper lobe  Past Medical History  Diagnosis Date  . Allergic rhinitis   . Eczema     Winter    Current outpatient prescriptions:  .  cetirizine (ZYRTEC) 10 MG tablet, Take 1 tablet (10 mg total) by mouth daily., Disp: 90 tablet, Rfl: 3 .  fluticasone (FLONASE) 50 MCG/ACT nasal spray, Two sprays each nostril daily, Disp:  48 g, Rfl: 3   Review of Systems No dyspnea on exertion, no cough, sputum production, wheezing No chest pain, palpitations No nausea, vomiting, diarrhea, constipation. No fevers, chills, loss of weight or appetite. All other review of systems are negative.    Objective:   Physical Exam Blood pressure 128/74, pulse 69, height 6' 1.5" (1.867 m), weight 226 lb (102.513 kg), SpO2 98 %. Gen: No apparent distress Neuro: No gross focal deficits. Neck: No JVD, lymphadenopathy, thyromegaly. RS: Clear, no wheeze or crackles. CVS: S1-S2 heard, no murmurs rubs gallops. Abdomen: Soft, positive bowel sounds. Extremities: No edema.    Assessment & Plan:  He was given a diagnosis of asbestosis based on his significant exposure history and reported abnormal chest x-ray. However a high res CT does not show any evidence of interstitial lung disease and his lung function is well-preserved on PFTs. He has good exercise capacity with no obvious impairments.   He will not need to follow up with [place] on a regular basis. He can have an annual chest x-ray performed at his primary care office.  Return to clinic when necessary.  Marshell Garfinkel MD [facility] Pager [phone] If no answer or after 3pm call: [phone] 12/12/2015, 4:33 PM

## 2016-03-19 ENCOUNTER — Encounter: Payer: Self-pay | Admitting: Family Medicine

## 2016-03-19 ENCOUNTER — Ambulatory Visit (INDEPENDENT_AMBULATORY_CARE_PROVIDER_SITE_OTHER): Payer: 59 | Admitting: Family Medicine

## 2016-03-19 VITALS — BP 124/80 | Temp 98.9°F | Ht 73.0 in | Wt 224.0 lb

## 2016-03-19 DIAGNOSIS — R109 Unspecified abdominal pain: Secondary | ICD-10-CM

## 2016-03-19 DIAGNOSIS — N23 Unspecified renal colic: Secondary | ICD-10-CM | POA: Diagnosis not present

## 2016-03-19 LAB — POCT URINALYSIS DIPSTICK
PH UA: 5
UROBILINOGEN UA: 2

## 2016-03-19 MED ORDER — TAMSULOSIN HCL 0.4 MG PO CAPS: 0.4000 mg | ORAL_CAPSULE | Freq: Every day | ORAL | 1 refills | Status: DC

## 2016-03-19 MED ORDER — CEFPROZIL 500 MG PO TABS: 500.0000 mg | ORAL_TABLET | Freq: Two times a day (BID) | ORAL | 0 refills | Status: DC

## 2016-03-19 MED ORDER — OXYCODONE-ACETAMINOPHEN 5-325 MG PO TABS: 1.0000 | ORAL_TABLET | ORAL | 0 refills | Status: DC | PRN

## 2016-03-19 NOTE — Progress Notes (Signed)
   Subjective:    Patient ID: John Marquez, male    DOB: 09-21-1955, 60 y.o.   MRN: [id number]  Abdominal Pain  This is a new problem. Episode onset: one week ago. Pain location: low back pain on both sides, right lower quad pain. Associated symptoms include vomiting.  This patient states he's had several episodes over the past week of flank discomfort had some at nighttime couple times a pain was so severe it caused him to be sick on his stomach one time is so severe he broke out in a bed sweat could not get comfortable patient relates the pain radiates from the right flank around the lower abdomen into his groin. He denies any injury. States appetite doing okay no fever or chills.  No family history or personal history kidney stones denies hematuria rectal bleeding  Review of Systems  Gastrointestinal: Positive for abdominal pain and vomiting.  See above     Objective:   Physical Exam  Lungs are clear hearts regular flank mild tenderness on the right side abdomen soft no guarding rebound or tenderness Careful and abdominal exam was obtained I find no evidence of appendicitis     Assessment & Plan:  Right flank pain consistent with the possibility of a kidney stone. Urinalysis did not show any blood but his symptomatology goes along with possible kidney stone patient not in severe distress currently severe for CAT scan does not need to be done currently and would save the patient radiation. Flomax 1 daily to help with the chance of kidney stone passing plenty of fluids. Pain medication when necessary cautioned drowsiness. Patient to recheck with [place] on Monday if not improving will need CT scan to make sure there is no other underlying issue I doubt diverticulitis I doubt retrocecal appendicitis. Patient to go to ER if severe flank pain with sweats chills and vomiting.

## 2016-03-19 NOTE — Patient Instructions (Signed)
Kidney Stones °Kidney stones (urolithiasis) are deposits that form inside your kidneys. The intense pain is caused by the stone moving through the urinary tract. When the stone moves, the ureter goes into spasm around the stone. The stone is usually passed in the urine.  °CAUSES  °· A disorder that makes certain neck glands produce too much parathyroid hormone (primary hyperparathyroidism). °· A buildup of uric acid crystals, similar to gout in your joints. °· Narrowing (stricture) of the ureter. °· A kidney obstruction present at birth (congenital obstruction). °· Previous surgery on the kidney or ureters. °· Numerous kidney infections. °SYMPTOMS  °· Feeling sick to your stomach (nauseous). °· Throwing up (vomiting). °· Blood in the urine (hematuria). °· Pain that usually spreads (radiates) to the groin. °· Frequency or urgency of urination. °DIAGNOSIS  °· Taking a history and physical exam. °· Blood or urine tests. °· CT scan. °· Occasionally, an examination of the inside of the urinary bladder (cystoscopy) is performed. °TREATMENT  °· Observation. °· Increasing your fluid intake. °· Extracorporeal shock wave lithotripsy--This is a noninvasive procedure that uses shock waves to break up kidney stones. °· Surgery may be needed if you have severe pain or persistent obstruction. There are various surgical procedures. Most of the procedures are performed with the use of small instruments. Only small incisions are needed to accommodate these instruments, so recovery time is minimized. °The size, location, and chemical composition are all important variables that will determine the proper choice of action for you. Talk to your health care provider to better understand your situation so that you will minimize the risk of injury to yourself and your kidney.  °HOME CARE INSTRUCTIONS  °· Drink enough water and fluids to keep your urine clear or pale yellow. This will help you to pass the stone or stone fragments. °· Strain  all urine through the provided strainer. Keep all particulate matter and stones for your health care provider to see. The stone causing the pain may be as small as a grain of salt. It is very important to use the strainer each and every time you pass your urine. The collection of your stone will allow your health care provider to analyze it and verify that a stone has actually passed. The stone analysis will often identify what you can do to reduce the incidence of recurrences. °· Only take over-the-counter or prescription medicines for pain, discomfort, or fever as directed by your health care provider. °· Keep all follow-up visits as told by your health care provider. This is important. °· Get follow-up X-rays if required. The absence of pain does not always mean that the stone has passed. It may have only stopped moving. If the urine remains completely obstructed, it can cause loss of kidney function or even complete destruction of the kidney. It is your responsibility to make sure X-rays and follow-ups are completed. Ultrasounds of the kidney can show blockages and the status of the kidney. Ultrasounds are not associated with any radiation and can be performed easily in a matter of minutes. °· Make changes to your daily diet as told by your health care provider. You may be told to: °¨ Limit the amount of salt that you eat. °¨ Eat 5 or more servings of fruits and vegetables each day. °¨ Limit the amount of meat, poultry, fish, and eggs that you eat. °· Collect a 24-hour urine sample as told by your health care provider. You may need to collect another urine sample every 6-12   months. °SEEK MEDICAL CARE IF: °· You experience pain that is progressive and unresponsive to any pain medicine you have been prescribed. °SEEK IMMEDIATE MEDICAL CARE IF:  °· Pain cannot be controlled with the prescribed medicine. °· You have a fever or shaking chills. °· The severity or intensity of pain increases over 18 hours and is not  relieved by pain medicine. °· You develop a new onset of abdominal pain. °· You feel faint or pass out. °· You are unable to urinate. °  °This information is not intended to replace advice given to you by your health care provider. Make sure you discuss any questions you have with your health care provider. °  °Document Released: 06/21/2005 Document Revised: 03/12/2015 Document Reviewed: 11/22/2012 °Elsevier Interactive Patient Education ©2016 [employer] ° °

## 2016-03-22 ENCOUNTER — Telehealth: Payer: Self-pay | Admitting: Family Medicine

## 2016-03-22 ENCOUNTER — Ambulatory Visit (HOSPITAL_COMMUNITY)
Admission: RE | Admit: 2016-03-22 | Discharge: 2016-03-22 | Disposition: A | Discharge status: Home / Self Care | Payer: 59 | Source: Ambulatory Visit | Attending: Family Medicine | Admitting: Family Medicine

## 2016-03-22 ENCOUNTER — Other Ambulatory Visit (HOSPITAL_COMMUNITY)
Admission: AD | Admit: 2016-03-22 | Discharge: 2016-03-22 | Disposition: A | Discharge status: Home / Self Care | Payer: 59 | Source: Ambulatory Visit | Attending: *Deleted | Admitting: *Deleted

## 2016-03-22 ENCOUNTER — Encounter: Payer: Self-pay | Admitting: Family Medicine

## 2016-03-22 ENCOUNTER — Ambulatory Visit (INDEPENDENT_AMBULATORY_CARE_PROVIDER_SITE_OTHER): Payer: 59 | Admitting: Family Medicine

## 2016-03-22 VITALS — BP 120/78 | Temp 98.2°F | Ht 73.0 in | Wt 220.0 lb

## 2016-03-22 DIAGNOSIS — R599 Enlarged lymph nodes, unspecified: Secondary | ICD-10-CM | POA: Diagnosis not present

## 2016-03-22 DIAGNOSIS — N281 Cyst of kidney, acquired: Secondary | ICD-10-CM | POA: Insufficient documentation

## 2016-03-22 DIAGNOSIS — N1339 Other hydronephrosis: Secondary | ICD-10-CM | POA: Diagnosis not present

## 2016-03-22 DIAGNOSIS — R1031 Right lower quadrant pain: Secondary | ICD-10-CM | POA: Diagnosis present

## 2016-03-22 DIAGNOSIS — N23 Unspecified renal colic: Secondary | ICD-10-CM | POA: Diagnosis present

## 2016-03-22 LAB — CBC WITH DIFFERENTIAL/PLATELET
Basophils Absolute: 0 10*3/uL (ref 0.0–0.1)
Basophils Relative: 1 %
Eosinophils Absolute: 0.2 10*3/uL (ref 0.0–0.7)
Eosinophils Relative: 4 %
HCT: 44.6 % (ref 39.0–52.0)
HEMOGLOBIN: 15.1 g/dL (ref 13.0–17.0)
LYMPHS ABS: 1.2 10*3/uL (ref 0.7–4.0)
Lymphocytes Relative: 20 %
MCH: 29.9 pg (ref 26.0–34.0)
MCHC: 33.9 g/dL (ref 30.0–36.0)
MCV: 88.3 fL (ref 78.0–100.0)
Monocytes Absolute: 0.8 10*3/uL (ref 0.1–1.0)
Monocytes Relative: 13 %
NEUTROS PCT: 62 %
Neutro Abs: 3.8 10*3/uL (ref 1.7–7.7)
Platelets: 364 10*3/uL (ref 150–400)
RBC: 5.05 MIL/uL (ref 4.22–5.81)
RDW: 13 % (ref 11.5–15.5)
WBC: 6.1 10*3/uL (ref 4.0–10.5)

## 2016-03-22 LAB — HEPATIC FUNCTION PANEL
ALBUMIN: 4.6 g/dL (ref 3.5–5.0)
ALK PHOS: 70 U/L (ref 38–126)
ALT: 22 U/L (ref 17–63)
AST: 21 U/L (ref 15–41)
BILIRUBIN TOTAL: 0.4 mg/dL (ref 0.3–1.2)
Bilirubin, Direct: 0.1 mg/dL (ref 0.1–0.5)
Indirect Bilirubin: 0.3 mg/dL (ref 0.3–0.9)
Total Protein: 7.4 g/dL (ref 6.5–8.1)

## 2016-03-22 LAB — BASIC METABOLIC PANEL
Anion gap: 9 (ref 5–15)
BUN: 20 mg/dL (ref 6–20)
CHLORIDE: 102 mmol/L (ref 101–111)
CO2: 27 mmol/L (ref 22–32)
Calcium: 9.4 mg/dL (ref 8.9–10.3)
Creatinine, Ser: 1.12 mg/dL (ref 0.61–1.24)
GFR calc Af Amer: >60 mL/min (ref >60)
GFR calc non Af Amer: >60 mL/min (ref >60)
GLUCOSE: 87 mg/dL (ref 65–99)
POTASSIUM: 4.1 mmol/L (ref 3.5–5.1)
Sodium: 138 mmol/L (ref 135–145)

## 2016-03-22 LAB — LIPASE, BLOOD: LIPASE: 28 U/L (ref 11–51)

## 2016-03-22 MED ORDER — IOPAMIDOL (ISOVUE-300) INJECTION 61%
100.0000 mL | Freq: Once | INTRAVENOUS | Status: AC | PRN
Start: 2016-03-22 — End: 2016-03-22
  Administered 2016-03-22: 100 mL via INTRAVENOUS

## 2016-03-22 MED ORDER — IOPAMIDOL (ISOVUE-300) INJECTION 61%
INTRAVENOUS | Status: AC
  Filled 2016-03-22: qty 30

## 2016-03-22 NOTE — Telephone Encounter (Signed)
I discussed the case with the patient. I told him he had a blocked ureter. We will need to get him set up with urology. Please see radiology results regarding discussion I had with the patient. FRONT-please put on the schedule John Marquez for a 10:30 AM appointment. Please also inform the other 10:30 appointment that I had an emergency work in in pressures their appointment to the 11:00 slot.

## 2016-03-22 NOTE — Progress Notes (Signed)
   Subjective:    Patient ID: John Marquez, male    DOB: [birth year], 60 y.o.   MRN: [id number]  HPI Patient is here today for a follow up visit on his kidney stone. Patient states that his pain has improved. Currently taking medication that was recently prescribed.  Patient has no other concerns at this time.  Patient does state he has had a couple times over the past few nights where he wakes up having use restroom feels pressure in his back but does not have any severe pain denies any blood in his urine denies fever chills appetite good denies sweats. Is still able to exercise. Energy level good.    Review of Systems See above. No nausea vomiting diarrhea no rectal bleeding no hematuria denies rash denies severe abdominal pain relates some flank pain    Objective:   Physical Exam Lungs are clear hearts regular abdomen is soft with minimal lower right lower quadrant pain and discomfort flank tenderness to percussion but not severe urinalysis without WBCs no RBCs       Assessment & Plan:  His symptoms go along with possibility of renal colic and possible kidney stone but it could be another process going on causing the kidney not to drain properly or causing other problems. There is a smaller possibility of appendicitis such as a retrocecal appendicitis. This patient needs lab work and CAT scan. This needs to be done stat await the results.

## 2016-03-23 ENCOUNTER — Other Ambulatory Visit (HOSPITAL_COMMUNITY): Payer: Self-pay | Admitting: Hematology & Oncology

## 2016-03-23 ENCOUNTER — Encounter: Payer: Self-pay | Admitting: Family Medicine

## 2016-03-23 ENCOUNTER — Ambulatory Visit (INDEPENDENT_AMBULATORY_CARE_PROVIDER_SITE_OTHER): Payer: 59 | Admitting: Family Medicine

## 2016-03-23 VITALS — BP 120/78 | Ht 73.0 in | Wt 220.0 lb

## 2016-03-23 DIAGNOSIS — N135 Crossing vessel and stricture of ureter without hydronephrosis: Secondary | ICD-10-CM | POA: Diagnosis not present

## 2016-03-23 DIAGNOSIS — C8330 Diffuse large B-cell lymphoma, unspecified site: Secondary | ICD-10-CM

## 2016-03-23 DIAGNOSIS — R590 Localized enlarged lymph nodes: Secondary | ICD-10-CM | POA: Insufficient documentation

## 2016-03-23 HISTORY — DX: Diffuse large B-cell lymphoma, unspecified site: C83.30

## 2016-03-23 NOTE — Addendum Note (Signed)
Addended by: Launa Grill on: 03/23/2016 08:12 AM   Modules accepted: Orders

## 2016-03-23 NOTE — Progress Notes (Signed)
   Subjective:    Patient ID: John Marquez, male    DOB: 1955-08-10, 60 y.o.   MRN: [id number]  HPI Patient is here today discuss his recent Ct scan results and referral to urologist.  The patient comes in today to [place] discuss a CAT scan results and lab work. Last evening I told him his lab work was normal and that we had further looking into with the CAT scan. I met with radiology Dr. Sharol Given a.m. We reviewed over this CAT scan results. It appeared that this is more likely a lymphoma or some other type of metastatic type growth. It was not felt that this could be I opts see be a CT-guided needle biopsy. It was recommended for the patient have a PET scan area in addition to this it was recommended for the patient to be further worked up as well. CAT scan results did show right hydronephrosis.  I reviewed the results with Dr. Tresa Moore urologist with [facility]. He was able to view the CAT scan and stated that there is moderate hydronephrosis they will be contacting the patient in the coming days to set him up for an office visit for eventually setting him up for a stent in the right ureter.  The patient denies any chest tightness pressure pain shortness of breath does relate some back pressure and pain that radiates into the right groin denies fevers chills sweats  Review of Systems    see above Objective:   Physical Exam 25-30 minutes was spent with this patient discussing the aspects of this health issue.       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 Whitney Muse. Their office will be calling the patient to set up the appointment. I've spoken with the patient. He is aware of this. Dr. Donald Pore office will be setting up PET scan as well as consultation. Will also be pursuing tissue diagnosis. Once this is obtained then can discuss treatment  options with the patient.  It should be noted that the patient and his wife were somewhat overwhelmed by the findings of the CAT scan. I have informed the family that the first step in this process is figuring out what the diagnosis actually is before we can lay out the treatment plan. They did relate that they may 1 to get a second opinion regarding all of this. That option was left open for them.  I also spoke with the patient this afternoon he is aware of the referrals. He is in ingredients with the course of action at this point in time.

## 2016-03-23 NOTE — Addendum Note (Signed)
Addended by: Launa Grill on: 03/23/2016 02:37 PM   Modules accepted: Orders

## 2016-03-23 NOTE — Progress Notes (Signed)
Referral for Urology was put in this morning. Referral for Oncology also put in for Dr.Penland.

## 2016-03-25 ENCOUNTER — Telehealth: Payer: Self-pay | Admitting: Family Medicine

## 2016-03-25 MED ORDER — PREDNISONE 20 MG PO TABS: ORAL_TABLET | ORAL | 0 refills | Status: DC

## 2016-03-25 NOTE — Telephone Encounter (Signed)
Please let the wife know we appreciate the update. I am also aware that Dr. Whitney Muse is seen him next week and that they have set up a PET scan for later next week. If they need our help in any way please call us

## 2016-03-25 NOTE — Telephone Encounter (Signed)
Per wife: pt has an appt with [facility] today 9/21 at 11 am.

## 2016-03-25 NOTE — Telephone Encounter (Signed)
Pt is needing something called in for poison oak. Pt has it on both legs and thighs.    [facility] [place]

## 2016-03-25 NOTE — Telephone Encounter (Signed)
Medication sent into pharmacy (Left message return call) 03/25/16

## 2016-03-25 NOTE — Telephone Encounter (Signed)
Adult pred taper 

## 2016-03-26 ENCOUNTER — Other Ambulatory Visit: Payer: Self-pay | Admitting: Urology

## 2016-03-26 NOTE — Telephone Encounter (Signed)
Left message to return call 

## 2016-03-26 NOTE — Telephone Encounter (Signed)
Patient picked up prednisone from pharmacy per pharmacy.

## 2016-03-29 NOTE — Progress Notes (Signed)
[facility] NOTE  Patient Care Team: Kathyrn Drown, MD as PCP - General (Family Medicine)  CHIEF COMPLAINTS/PURPOSE OF CONSULTATION:  Abnormal imaging of abdomen  HISTORY OF PRESENTING ILLNESS:  John Marquez 60 y.o. male is here because of referral from Dr. Wolfgang Phoenix.  He notes that starting 2 weeks ago he developed pain in his back. He states it felt like back muscle spasms. He had been doing heavy lifting and thought he had pulled a muscle. He then states the next day he felt ok, that evening however he developed severe pain. Ibuprofen did not help. This went on for several evenings until Thursday of that week the pain was so severe he had vomiting and localization of his pain to the R side. He thought maybe it was a kidney stone. He saw Dr. Wolfgang Phoenix on Friday and was given flomax, he notes things didn't improve through the weekend and he was sent for a CT of the abdomen on Monday 03/22/2016.  Ct of the abdomen performed on 9/18 showed ill defined soft tissue masses and enlarged lymph nodes in the mesentery and retroperitoneum, suspicious for Lymphoma. R sided hydronephrosis due to involvement of the R ureter by the retroperitoneal process and also partial obstruction of the L ureter. The patient is scheduled to see urology on Monday October 2nd for stent placement.  Currently he denies abdominal pain, nausea, vomiting, chang in bowel habits.  He denies difficulty urinating.  No night sweats, No fever or chills. No change in PS. He is active. No change in appetite. No weight loss.  The patient is here for further evaluation and discussion of abnormal imaging of the abdomen and obtaining and diagnosis.    MEDICAL HISTORY:  Past Medical History:  Diagnosis Date  . Allergic rhinitis   . Eczema    Winter    SURGICAL HISTORY: Past Surgical History:  Procedure Laterality Date  . COLONOSCOPY      SOCIAL HISTORY: Social History   Social History  . Marital  status: Married    Spouse name: N/A  . Number of children: N/A  . Years of education: N/A   Occupational History  . Not on file.   Social History Main Topics  . Smoking status: Never Smoker  . Smokeless tobacco: Never Used  . Alcohol use 0.0 oz/week  . Drug use: No  . Sexual activity: Not on file   Other Topics Concern  . Not on file   Social History Narrative  . No narrative on file  Works for [employer]. Enjoys Animator, Biomedical scientist. NEVER smoker, occasional Beer. No Pets Married for 34 years 2 daughters, both married, no grandchildren  FAMILY HISTORY: Family History  Problem Relation Age of Onset  . Hypertension Father   Brother alive at 18 with hypertension Father deceased at 17 from Lung cancer he was a smoker Mother alive at 4, healthy, mild dementia, diabetes  ALLERGIES:  is allergic to penicillins and levaquin [levofloxacin in d5w].  MEDICATIONS:  Current Outpatient Prescriptions  Medication Sig Dispense Refill  . cefPROZIL (CEFZIL) 500 MG tablet Take 1 tablet (500 mg total) by mouth 2 (two) times daily. 14 tablet 0  . oxyCODONE-acetaminophen (PERCOCET/ROXICET) 5-325 MG tablet Take 1 tablet by mouth every 4 (four) hours as needed. 30 tablet 0  . predniSONE (DELTASONE) 20 MG tablet Take 3 tablet by mouth for 3 days, then take 2 tablet by mouth for 3 days, then take 1 tablet by mouth  for 2 days. 17 tablet 0  . tamsulosin (FLOMAX) 0.4 MG CAPS capsule Take 1 capsule (0.4 mg total) by mouth daily. 20 capsule 1   No current facility-administered medications for this visit.     Review of Systems  Constitutional: Negative for chills, fever, malaise/fatigue and weight loss.  HENT: Negative for congestion, hearing loss, nosebleeds, sore throat and tinnitus.   Eyes: Negative for blurred vision, double vision, pain and discharge.  Respiratory: Negative for cough, hemoptysis, sputum production, shortness of breath and wheezing.   Cardiovascular: Negative for chest  pain, palpitations, claudication, leg swelling and PND.  Gastrointestinal: Positive for abdominal pain. Negative for blood in stool, constipation, diarrhea, heartburn, melena, nausea and vomiting.  Genitourinary: Negative for dysuria, frequency, hematuria and urgency.  Musculoskeletal: Negative for falls, joint pain and myalgias.  Skin: Negative for itching and rash.  Neurological: Negative for dizziness, tingling, tremors, sensory change, speech change, focal weakness, seizures, loss of consciousness, weakness and headaches.  Endo/Heme/Allergies: Does not bruise/bleed easily.  Psychiatric/Behavioral: Negative for depression, memory loss, substance abuse and suicidal ideas. The patient is not nervous/anxious and does not have insomnia.    14 point ROS was done and is otherwise as detailed above or in HPI   PHYSICAL EXAMINATION: ECOG PERFORMANCE STATUS: 0 - Asymptomatic   Vitals - 1 value per visit [id number]  SYSTOLIC [id number]  DIASTOLIC 70  Pulse 72  Temperature 97.6  Respirations 16  Weight (lb) 226.9  Height 6' 1.25"  BMI 29.73  VISIT REPORT     Physical Exam  Constitutional: He is oriented to person, place, and time and well-developed, well-nourished, and in no distress.  HENT:  Head: Normocephalic and atraumatic.  Nose: Nose normal.  Mouth/Throat: Oropharynx is clear and moist. No oropharyngeal exudate.  Eyes: Conjunctivae and EOM are normal. Pupils are equal, round, and reactive to light. Right eye exhibits no discharge. Left eye exhibits no discharge. No scleral icterus.  Neck: Normal range of motion. Neck supple. No tracheal deviation present. No thyromegaly present.  Cardiovascular: Normal rate, regular rhythm and normal heart sounds.  Exam reveals no gallop and no friction rub.   No murmur heard. Pulmonary/Chest: Effort normal and breath sounds normal. He has no wheezes. He has no rales.  Abdominal: Soft. Bowel sounds are normal. He exhibits no distension and no mass. There  is no tenderness. There is no rebound and no guarding.  Musculoskeletal: Normal range of motion. He exhibits no edema.  Lymphadenopathy:    He has no cervical adenopathy.  Neurological: He is alert and oriented to person, place, and time. He has normal reflexes. No cranial nerve deficit. Gait normal. Coordination normal.  Skin: Skin is warm and dry. No rash noted.  Psychiatric: Mood, memory, affect and judgment normal.  Nursing note and vitals reviewed.   LABORATORY DATA:  I have reviewed the data as listed Lab Results  Component Value Date   WBC 6.1 03/22/2016   HGB 15.1 03/22/2016   HCT 44.6 03/22/2016   MCV 88.3 03/22/2016   PLT 364 03/22/2016   CMP     Component Value Date/Time   NA 138 03/22/2016 1740   NA 139 04/22/2015 0924   K 4.1 03/22/2016 1740   CL 102 03/22/2016 1740   CO2 27 03/22/2016 1740   GLUCOSE 87 03/22/2016 1740   BUN 20 03/22/2016 1740   BUN 16 04/22/2015 0924   CREATININE 1.12 03/22/2016 1740   CREATININE 1.17 04/24/2014 0800   CALCIUM 9.4 03/22/2016 1740  PROT 7.4 03/22/2016 1740   PROT 6.8 04/22/2015 0924   ALBUMIN 4.6 03/22/2016 1740   ALBUMIN 4.6 04/22/2015 0924   AST 21 03/22/2016 1740   ALT 22 03/22/2016 1740   ALKPHOS 70 03/22/2016 1740   BILITOT 0.4 03/22/2016 1740   BILITOT 0.6 04/22/2015 0924   GFRNONAA >60 03/22/2016 1740   GFRAA >60 03/22/2016 1740     RADIOGRAPHIC STUDIES: I have personally reviewed the radiological images as listed and agreed with the findings in the report. Study Result   CLINICAL DATA:  Right renal college and right lower quadrant abdominal pain for 1 week.  EXAM: CT ABDOMEN AND PELVIS WITH CONTRAST  TECHNIQUE: Multidetector CT imaging of the abdomen and pelvis was performed using the standard protocol following bolus administration of intravenous contrast.  CONTRAST:  121mL ISOVUE-300 IOPAMIDOL (ISOVUE-300) INJECTION 61%  COMPARISON:  CT chest 05/27/2015  FINDINGS: Lower chest: The lung  bases are clear of acute process. No pleural effusion or pulmonary lesions. The heart is normal in size. No pericardial effusion. The distal esophagus and aorta are unremarkable.  Hepatobiliary: No focal hepatic lesions or intrahepatic biliary dilatation. The gallbladder is normal. No common bile duct dilatation.  Pancreas: No mass, inflammation or ductal dilatation.  Spleen: Normal size.  No focal lesions.  Adrenals/Urinary Tract: The adrenal glands are normal.  The right kidney demonstrates hydronephrosis. The left kidney demonstrates parapelvic cysts. The right ureter appears to be obstructed by a retroperitoneal process and the left ureter may also be partly obstructed. No renal mass is identified. No bladder mass.  Stomach/Bowel: The stomach, duodenum, small bowel and colon are grossly normal. No inflammatory changes, mass lesions or obstructive findings. There is a low-lying transverse cecum. The terminal ileum and appendix are normal. There is a moderate amount of stool throughout the colon which may suggest constipation.  Vascular/Lymphatic: There are ill-defined areas of mesenteric soft tissue density along with scattered mesenteric lymph nodes. A mesenteric "Mass" measures 23.5 x 35 mm on image number 43. A second mass measures 41 x 29 mm on image 49. There is also ill-defined soft tissue density in the retroperitoneum surrounding the IVC and aorta largest nodal area measures 4.0 x 2.5 cm on image number 53. This process continues down into the pelvis adjacent to the iliac vessels, right greater than left. Soft tissue mass on the right side measures a maximum of 4.5 cm on image number 67.  The aorta is normal in caliber. The branch vessels are patent. The major venous structures are patent. These findings could be due to lymphoma.  Reproductive: The prostate gland and seminal vesicles are unremarkable.  Other: No discrete pelvic mass. Ill-defined pelvic  lymph nodes in soft tissue density. No inguinal mass or adenopathy.  Musculoskeletal: No significant bony findings.  IMPRESSION: 1. Ill-defined soft tissue masses and enlarged lymph nodes in the mesenteric and retroperitoneum. 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 I do not see an obvious GI tumor to account for this. It is unlikely retroperitoneal fibrosis given the mesenteric findings. PET-CT and tissue sampling may be helpful for staging and diagnosis. 2. Right-sided hydronephrosis due to involvement of the right ureter by the retroperitoneal process. There is also probable partial obstruction of the left ureter. Patient may need urologic consultation for stenting.   Electronically Signed   By: Marijo Sanes M.D.   On: 03/22/2016 20:26     ASSESSMENT & PLAN:  Abnormal imaging of Abdomen R sided hydronephrosis Recent Abdominal/Back pain Retroperitoneal adenopathy  We spent  a long time in discussion regarding recent CT findings. I extensively reviewed the process of diagnosis, including his PET. Unfortunately on exam there are no easily accessible sites of adenopathy for biopsy. I suspect his disease is confined to the abdomen.  We discussed that ideally in diagnosing a lymphoma an excisional biopsy is preferred and I explained what that meant, I discussed the rationale of why. WE did however discuss that obtaining a diagnosis via the least invasive method is preferred and if PET imaging only shows intra-abdominal disease, we will see if IR can obtain core biopsies of any of the lesions noted.  He is scheduled for stent placement with urology on Monday. Currently denies any problems with urination. Currently denies any abdominal pain.  I will notify the patient once I have reviewed his PET imaging regarding plans for biopsy. PET is scheduled for Thursday am.   All questions were answered. The patient knows to call the clinic with any problems, questions or concerns. I spent 60  minutes counseling the patient face to face. The total time spent in the appointment was 80 minutes and more than 50% was on counseling and review of test results  This document serves as a record of services personally performed by Ancil Linsey, MD. It was created on her behalf by Arlyce Harman, a trained medical scribe. The creation of this record is based on the scribe's personal observations and the provider's statements to them. This document has been checked and approved by the attending provider.  I have reviewed the above documentation for accuracy and completeness and I agree with the above.  This note was electronically signed.    Molli Hazard, MD  03/29/2016 11:40 AM

## 2016-03-29 NOTE — Telephone Encounter (Signed)
Patient stated he will keep [place] updated

## 2016-03-30 ENCOUNTER — Encounter (HOSPITAL_COMMUNITY): Payer: Self-pay | Admitting: Hematology & Oncology

## 2016-03-30 ENCOUNTER — Encounter (HOSPITAL_COMMUNITY): Payer: 59 | Attending: Hematology & Oncology | Admitting: Hematology & Oncology

## 2016-03-30 VITALS — BP 122/70 | HR 72 | Temp 97.6°F | Resp 16 | Ht 73.25 in | Wt 226.9 lb

## 2016-03-30 DIAGNOSIS — R109 Unspecified abdominal pain: Secondary | ICD-10-CM | POA: Diagnosis not present

## 2016-03-30 DIAGNOSIS — Z23 Encounter for immunization: Secondary | ICD-10-CM | POA: Diagnosis not present

## 2016-03-30 DIAGNOSIS — Z0000 Encounter for general adult medical examination without abnormal findings: Secondary | ICD-10-CM

## 2016-03-30 DIAGNOSIS — M549 Dorsalgia, unspecified: Secondary | ICD-10-CM | POA: Diagnosis not present

## 2016-03-30 DIAGNOSIS — R590 Localized enlarged lymph nodes: Secondary | ICD-10-CM

## 2016-03-30 DIAGNOSIS — R599 Enlarged lymph nodes, unspecified: Secondary | ICD-10-CM

## 2016-03-30 DIAGNOSIS — N135 Crossing vessel and stricture of ureter without hydronephrosis: Secondary | ICD-10-CM

## 2016-03-30 DIAGNOSIS — N1330 Unspecified hydronephrosis: Secondary | ICD-10-CM | POA: Diagnosis not present

## 2016-03-30 DIAGNOSIS — R935 Abnormal findings on diagnostic imaging of other abdominal regions, including retroperitoneum: Secondary | ICD-10-CM

## 2016-03-30 MED ORDER — INFLUENZA VAC SPLIT QUAD 0.5 ML IM SUSY
0.5000 mL | PREFILLED_SYRINGE | Freq: Once | INTRAMUSCULAR | Status: AC
  Administered 2016-03-30: 0.5 mL via INTRAMUSCULAR
  Filled 2016-03-30: qty 0.5

## 2016-03-30 NOTE — Patient Instructions (Addendum)
[facility] at [facility] Discharge Instructions  RECOMMENDATIONS MADE BY THE CONSULTANT AND ANY TEST RESULTS WILL BE SENT TO YOUR REFERRING PHYSICIAN.  You saw Dr.Penland today. We will call you after the PET scan.  Thank you for choosing [place] at [facility] to provide your oncology and hematology care.  To afford each patient quality time with our provider, please arrive at least 15 minutes before your scheduled appointment time.   Beginning January 23rd 2017 lab work for the [facility] will be done in the  Main lab at [facility] on 1st floor. If you have a lab appointment with the [facility] please come in thru the  Main Entrance and check in at the main information desk  You need to re-schedule your appointment should you arrive 10 or more minutes late.  We strive to give you quality time with our providers, and arriving late affects you and other patients whose appointments are after yours.  Also, if you no show three or more times for appointments you may be dismissed from the clinic at the providers discretion.     Again, thank you for choosing [facility].  Our hope is that these requests will decrease the amount of time that you wait before being seen by our physicians.       _____________________________________________________________  Should you have questions after your visit to [facility], please contact our office at [phone] between the hours of 8:30 a.m. and 4:30 p.m.  Voicemails left after 4:30 p.m. will not be returned until the following business day.  For prescription refill requests, have your pharmacy contact our office.         Resources For Cancer Patients and their Caregivers ? American Cancer Society: Can assist with transportation, wigs, general needs, runs Look Good Feel Better.        (249)010-0951 ? Cancer Care: Provides financial assistance, online support groups,  medication/co-pay assistance.  1-800-813-HOPE 412-435-9629) ? [facility] Assists [place] cancer patients and their families through emotional , educational and financial support.  2122326675 ? [place] DSS Where to apply for food stamps, Medicaid and utility assistance. 424 372 4878 ? [facility]: Transportation to medical appointments. (435)400-8640 ? Social Security Administration: May apply for disability if have a Stage IV cancer. 780 243 2480 (865)223-5617 ? [employer], Disability and Transit Services: Assists with nutrition, care and transit needs. [facility] Support Programs: @10RELATIVEDAYS @ > Cancer Support Group  2nd Tuesday of the month 1pm-2pm, Journey Room  > Creative Journey  3rd Tuesday of the month 1130am-1pm, Journey Room  > Look Good Feel Better  1st Wednesday of the month 10am-12 noon, Journey Room (Call [facility] to register [phone])

## 2016-03-30 NOTE — Progress Notes (Signed)
John Marquez presents today for injection per MD orders. Flu vaccine administered IM in left Upper Arm. Administration without incident. Patient tolerated well.

## 2016-03-31 ENCOUNTER — Telehealth (HOSPITAL_COMMUNITY): Payer: Self-pay | Admitting: Oncology

## 2016-03-31 NOTE — Telephone Encounter (Signed)
Peer to peer completed after 20 minute hold time.  PET is approved; [id number].  Elishia Kaczorowski, PA-C 03/31/2016 11:22 AM

## 2016-04-01 ENCOUNTER — Ambulatory Visit (HOSPITAL_COMMUNITY)
Admission: RE | Admit: 2016-04-01 | Discharge: 2016-04-01 | Disposition: A | Discharge status: Home / Self Care | Payer: 59 | Source: Ambulatory Visit | Attending: Hematology & Oncology | Admitting: Hematology & Oncology

## 2016-04-01 ENCOUNTER — Encounter (HOSPITAL_BASED_OUTPATIENT_CLINIC_OR_DEPARTMENT_OTHER): Payer: Self-pay | Admitting: *Deleted

## 2016-04-01 ENCOUNTER — Other Ambulatory Visit (HOSPITAL_COMMUNITY): Payer: Self-pay | Admitting: Hematology & Oncology

## 2016-04-01 DIAGNOSIS — N281 Cyst of kidney, acquired: Secondary | ICD-10-CM | POA: Insufficient documentation

## 2016-04-01 DIAGNOSIS — R599 Enlarged lymph nodes, unspecified: Secondary | ICD-10-CM | POA: Diagnosis present

## 2016-04-01 DIAGNOSIS — R590 Localized enlarged lymph nodes: Secondary | ICD-10-CM

## 2016-04-01 DIAGNOSIS — N1330 Unspecified hydronephrosis: Secondary | ICD-10-CM | POA: Diagnosis not present

## 2016-04-01 DIAGNOSIS — N135 Crossing vessel and stricture of ureter without hydronephrosis: Secondary | ICD-10-CM

## 2016-04-01 LAB — GLUCOSE, CAPILLARY: Glucose-Capillary: 87 mg/dL (ref 65–99)

## 2016-04-01 MED ORDER — FLUDEOXYGLUCOSE F - 18 (FDG) INJECTION
11.2800 | Freq: Once | INTRAVENOUS | Status: AC | PRN
  Administered 2016-04-01: 11.28 via INTRAVENOUS

## 2016-04-02 ENCOUNTER — Emergency Department (HOSPITAL_COMMUNITY): Payer: 59 | Admitting: Certified Registered"

## 2016-04-02 ENCOUNTER — Encounter (HOSPITAL_COMMUNITY)
Admission: EM | Disposition: A | Discharge status: Home / Self Care | Payer: Self-pay | Source: Home / Self Care | Attending: Emergency Medicine

## 2016-04-02 ENCOUNTER — Emergency Department (HOSPITAL_COMMUNITY)
Admission: EM | Admit: 2016-04-02 | Discharge: 2016-04-02 | Disposition: A | Discharge status: Home / Self Care | Payer: 59 | Attending: Emergency Medicine | Admitting: Emergency Medicine

## 2016-04-02 ENCOUNTER — Other Ambulatory Visit: Payer: Self-pay | Admitting: Radiology

## 2016-04-02 ENCOUNTER — Emergency Department (HOSPITAL_COMMUNITY): Payer: 59

## 2016-04-02 ENCOUNTER — Telehealth: Payer: Self-pay | Admitting: Family Medicine

## 2016-04-02 ENCOUNTER — Encounter (HOSPITAL_COMMUNITY): Payer: Self-pay

## 2016-04-02 DIAGNOSIS — R590 Localized enlarged lymph nodes: Secondary | ICD-10-CM | POA: Diagnosis not present

## 2016-04-02 DIAGNOSIS — R339 Retention of urine, unspecified: Secondary | ICD-10-CM

## 2016-04-02 DIAGNOSIS — Z85820 Personal history of malignant melanoma of skin: Secondary | ICD-10-CM | POA: Insufficient documentation

## 2016-04-02 DIAGNOSIS — R109 Unspecified abdominal pain: Secondary | ICD-10-CM

## 2016-04-02 DIAGNOSIS — R10A1 Flank pain, right side: Secondary | ICD-10-CM

## 2016-04-02 DIAGNOSIS — N131 Hydronephrosis with ureteral stricture, not elsewhere classified: Secondary | ICD-10-CM | POA: Diagnosis not present

## 2016-04-02 DIAGNOSIS — R7303 Prediabetes: Secondary | ICD-10-CM | POA: Diagnosis not present

## 2016-04-02 DIAGNOSIS — N289 Disorder of kidney and ureter, unspecified: Secondary | ICD-10-CM

## 2016-04-02 HISTORY — PX: CYSTOSCOPY WITH STENT PLACEMENT: SHX5790

## 2016-04-02 LAB — CBC WITH DIFFERENTIAL/PLATELET
BASOS PCT: 0 %
Basophils Absolute: 0 10*3/uL (ref 0.0–0.1)
EOS ABS: 0.1 10*3/uL (ref 0.0–0.7)
EOS PCT: 0 %
HCT: 43.6 % (ref 39.0–52.0)
HEMOGLOBIN: 15.3 g/dL (ref 13.0–17.0)
Lymphocytes Relative: 7 %
Lymphs Abs: 0.8 10*3/uL (ref 0.7–4.0)
MCH: 29.6 pg (ref 26.0–34.0)
MCHC: 35.1 g/dL (ref 30.0–36.0)
MCV: 84.3 fL (ref 78.0–100.0)
MONO ABS: 0.9 10*3/uL (ref 0.1–1.0)
MONOS PCT: 8 %
NEUTROS PCT: 85 %
Neutro Abs: 9.9 10*3/uL — ABNORMAL HIGH (ref 1.7–7.7)
PLATELETS: 325 10*3/uL (ref 150–400)
RBC: 5.17 MIL/uL (ref 4.22–5.81)
RDW: 13 % (ref 11.5–15.5)
WBC: 11.7 10*3/uL — ABNORMAL HIGH (ref 4.0–10.5)

## 2016-04-02 LAB — BASIC METABOLIC PANEL
Anion gap: 8 (ref 5–15)
BUN: 20 mg/dL (ref 6–20)
CALCIUM: 9.1 mg/dL (ref 8.9–10.3)
CO2: 26 mmol/L (ref 22–32)
CREATININE: 1.42 mg/dL — AB (ref 0.61–1.24)
Chloride: 100 mmol/L — ABNORMAL LOW (ref 101–111)
GFR calc non Af Amer: 53 mL/min — ABNORMAL LOW (ref >60)
Glucose, Bld: 107 mg/dL — ABNORMAL HIGH (ref 65–99)
Potassium: 4.1 mmol/L (ref 3.5–5.1)
SODIUM: 134 mmol/L — AB (ref 135–145)

## 2016-04-02 LAB — URINALYSIS, ROUTINE W REFLEX MICROSCOPIC
BILIRUBIN URINE: NEGATIVE
Glucose, UA: NEGATIVE mg/dL
Hgb urine dipstick: NEGATIVE
KETONES UR: 15 mg/dL — AB
Leukocytes, UA: NEGATIVE
NITRITE: NEGATIVE
PROTEIN: NEGATIVE mg/dL
Specific Gravity, Urine: 1.025 (ref 1.005–1.030)
pH: 6 (ref 5.0–8.0)

## 2016-04-02 SURGERY — CYSTOSCOPY, WITH STENT INSERTION
Anesthesia: General | Laterality: Right

## 2016-04-02 MED ORDER — DEXAMETHASONE SODIUM PHOSPHATE 10 MG/ML IJ SOLN
INTRAMUSCULAR | Status: DC | PRN
  Administered 2016-04-02: 10 mg via INTRAVENOUS

## 2016-04-02 MED ORDER — LIDOCAINE 2% (20 MG/ML) 5 ML SYRINGE
INTRAMUSCULAR | Status: DC | PRN
  Administered 2016-04-02: 100 mg via INTRAVENOUS

## 2016-04-02 MED ORDER — TAMSULOSIN HCL 0.4 MG PO CAPS: 0.4000 mg | ORAL_CAPSULE | Freq: Once | ORAL | Status: DC | PRN

## 2016-04-02 MED ORDER — MIDAZOLAM HCL 5 MG/5ML IJ SOLN
INTRAMUSCULAR | Status: DC | PRN
  Administered 2016-04-02: 2 mg via INTRAVENOUS

## 2016-04-02 MED ORDER — KETOROLAC TROMETHAMINE 15 MG/ML IJ SOLN
15.0000 mg | Freq: Once | INTRAMUSCULAR | Status: AC
  Administered 2016-04-02: 15 mg via INTRAVENOUS
  Filled 2016-04-02: qty 1

## 2016-04-02 MED ORDER — MEPERIDINE HCL 50 MG/ML IJ SOLN: 6.2500 mg | INTRAMUSCULAR | Status: DC | PRN

## 2016-04-02 MED ORDER — LACTATED RINGERS IV SOLN
INTRAVENOUS | Status: DC
  Administered 2016-04-02: 20:00:00 via INTRAVENOUS

## 2016-04-02 MED ORDER — HYDROMORPHONE HCL 1 MG/ML IJ SOLN
1.0000 mg | Freq: Once | INTRAMUSCULAR | Status: AC
  Administered 2016-04-02: 1 mg via INTRAVENOUS
  Filled 2016-04-02: qty 1

## 2016-04-02 MED ORDER — ONDANSETRON HCL 4 MG/2ML IJ SOLN: 4.0000 mg | Freq: Once | INTRAMUSCULAR | Status: DC | PRN

## 2016-04-02 MED ORDER — CLINDAMYCIN PHOSPHATE 300 MG/2ML IJ SOLN
900.0000 mg | INTRAMUSCULAR | Status: DC
  Filled 2016-04-02: qty 6

## 2016-04-02 MED ORDER — SODIUM CHLORIDE 0.9 % IV SOLN
INTRAVENOUS | Status: DC | PRN
  Administered 2016-04-02: 50 mL

## 2016-04-02 MED ORDER — LIDOCAINE HCL 2 % EX GEL
CUTANEOUS | Status: DC | PRN
  Administered 2016-04-02: 1

## 2016-04-02 MED ORDER — DEXAMETHASONE SODIUM PHOSPHATE 10 MG/ML IJ SOLN
INTRAMUSCULAR | Status: AC
Start: 2016-04-02 — End: 2016-04-02
  Filled 2016-04-02: qty 1

## 2016-04-02 MED ORDER — STERILE WATER FOR IRRIGATION IR SOLN
Status: DC | PRN
  Administered 2016-04-02: 1000 mL

## 2016-04-02 MED ORDER — MIDAZOLAM HCL 2 MG/2ML IJ SOLN
INTRAMUSCULAR | Status: AC
  Filled 2016-04-02: qty 2

## 2016-04-02 MED ORDER — PROPOFOL 10 MG/ML IV BOLUS
INTRAVENOUS | Status: AC
  Filled 2016-04-02: qty 20

## 2016-04-02 MED ORDER — LIDOCAINE HCL 2 % EX GEL
CUTANEOUS | Status: AC
  Filled 2016-04-02: qty 5

## 2016-04-02 MED ORDER — LACTATED RINGERS IV SOLN
INTRAVENOUS | Status: DC | PRN
  Administered 2016-04-02: 18:00:00 via INTRAVENOUS

## 2016-04-02 MED ORDER — ONDANSETRON HCL 4 MG/2ML IJ SOLN
INTRAMUSCULAR | Status: AC
  Filled 2016-04-02: qty 2

## 2016-04-02 MED ORDER — LIDOCAINE 2% (20 MG/ML) 5 ML SYRINGE
INTRAMUSCULAR | Status: AC
  Filled 2016-04-02: qty 5

## 2016-04-02 MED ORDER — OXYCODONE HCL 5 MG PO TABS: 5.0000 mg | ORAL_TABLET | Freq: Once | ORAL | Status: DC | PRN

## 2016-04-02 MED ORDER — ONDANSETRON HCL 4 MG/2ML IJ SOLN
INTRAMUSCULAR | Status: DC | PRN
  Administered 2016-04-02: 4 mg via INTRAVENOUS

## 2016-04-02 MED ORDER — PROPOFOL 10 MG/ML IV BOLUS
INTRAVENOUS | Status: DC | PRN
  Administered 2016-04-02: 200 mg via INTRAVENOUS

## 2016-04-02 MED ORDER — CLINDAMYCIN PHOSPHATE 900 MG/50ML IV SOLN
900.0000 mg | Freq: Once | INTRAVENOUS | Status: AC
  Administered 2016-04-02: 900 mg via INTRAVENOUS

## 2016-04-02 MED ORDER — FENTANYL CITRATE (PF) 100 MCG/2ML IJ SOLN
INTRAMUSCULAR | Status: AC
  Filled 2016-04-02: qty 2

## 2016-04-02 MED ORDER — HYDROMORPHONE HCL 1 MG/ML IJ SOLN: 0.2500 mg | INTRAMUSCULAR | Status: DC | PRN

## 2016-04-02 MED ORDER — CLINDAMYCIN PHOSPHATE 900 MG/50ML IV SOLN
INTRAVENOUS | Status: AC
  Filled 2016-04-02: qty 50

## 2016-04-02 MED ORDER — OXYCODONE HCL 5 MG/5ML PO SOLN
5.0000 mg | Freq: Once | ORAL | Status: DC | PRN
  Filled 2016-04-02: qty 5

## 2016-04-02 MED ORDER — OXYCODONE-ACETAMINOPHEN 5-325 MG PO TABS
1.0000 | ORAL_TABLET | Freq: Once | ORAL | Status: DC | PRN
Start: 2016-04-02 — End: 2016-04-02

## 2016-04-02 MED ORDER — FENTANYL CITRATE (PF) 100 MCG/2ML IJ SOLN
INTRAMUSCULAR | Status: DC | PRN
  Administered 2016-04-02: 100 ug via INTRAVENOUS

## 2016-04-02 MED ORDER — PHENAZOPYRIDINE HCL 200 MG PO TABS: 200.0000 mg | ORAL_TABLET | Freq: Once | ORAL | Status: DC | PRN

## 2016-04-02 SURGICAL SUPPLY — 10 items
BAG URO CATCHER STRL LF (MISCELLANEOUS) ×2 IMPLANT
CATH INTERMIT  6FR 70CM (CATHETERS) ×2 IMPLANT
CLOTH BEACON ORANGE TIMEOUT ST (SAFETY) ×2 IMPLANT
GLOVE BIOGEL M STRL SZ7.5 (GLOVE) ×2 IMPLANT
GOWN STRL REUS W/TWL LRG LVL3 (GOWN DISPOSABLE) ×4 IMPLANT
GUIDEWIRE STR DUAL SENSOR (WIRE) ×2 IMPLANT
MANIFOLD NEPTUNE II (INSTRUMENTS) ×2 IMPLANT
PACK CYSTO (CUSTOM PROCEDURE TRAY) ×2 IMPLANT
STENT URET 6FRX26 CONTOUR (STENTS) ×1 IMPLANT
TUBING CONNECTING 10 (TUBING) IMPLANT

## 2016-04-02 NOTE — Telephone Encounter (Signed)
Patient has some questions about his urination issues. If you could call to discuss what route he should take now.

## 2016-04-02 NOTE — Telephone Encounter (Signed)
Left message return call 04/02/16

## 2016-04-02 NOTE — ED Triage Notes (Addendum)
Pt c/o urinary retention starting last night.  Pain score 8/10.  Pt reports that this has been an ongoing problem x "a couple weeks."  Sts he has an appointment for a stent placement on 10/3.  Pt is followed by [facility].  Sts "they originally thought it was kidney stones, but now, they think is it swollen lymph nodes."

## 2016-04-02 NOTE — ED Provider Notes (Signed)
[facility] DEPT Provider Note   CSN: [id number] Arrival date & time: 04/02/16  1039     History   Chief Complaint Chief Complaint  Patient presents with  . Urinary Retention    HPI John Marquez is a 60 y.o. male.  HPI Reviewed-year-old male who presents with several days of urinary retention and right flank pain. Patient had recent workup oncology concerning for lymphoma with lymphadenopathy obstructing the right ureter and evidence of right hydronephrosis. Patient is scheduled for stenting by urology next week however pain has been more persistent and unbearable. He endorses some nausea but no vomiting. He denies any fevers, chills, chest pain, shortness of breath.   Past Medical History:  Diagnosis Date  . Allergic rhinitis   . Eczema    Winter  . History of melanoma excision    07-23-2015 and 07-31-2015  mid upper back  . Hydronephrosis, right   . Retroperitoneal lymphadenopathy     Patient Active Problem List   Diagnosis Date Noted  . 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 Melanoma in situ ([place]) 08/10/2015  . Prediabetes 05/14/2013    Past Surgical History:  Procedure Laterality Date  . COLONOSCOPY  2010       Home Medications    Prior to Admission medications   Medication Sig Start Date End Date Taking? Authorizing Provider  ibuprofen (ADVIL,MOTRIN) 200 MG tablet Take 200-800 mg by mouth every 6 (six) hours as needed for moderate pain.   Yes Historical Provider, MD  oxyCODONE-acetaminophen (PERCOCET/ROXICET) 5-325 MG tablet Take 1 tablet by mouth every 4 (four) hours as needed. Patient taking differently: Take 1 tablet by mouth every 4 (four) hours as needed for moderate pain.  03/19/16  Yes Kathyrn Drown, MD  predniSONE (DELTASONE) 20 MG tablet Take 3 tablet by mouth for 3 days, then take 2 tablet by mouth for 3 days, then take 1 tablet by mouth for 2 days. 03/25/16  Yes Mikey Kirschner, MD    tamsulosin (FLOMAX) 0.4 MG CAPS capsule Take 1 capsule (0.4 mg total) by mouth daily. 03/19/16  Yes Kathyrn Drown, MD  cefPROZIL (CEFZIL) 500 MG tablet Take 1 tablet (500 mg total) by mouth 2 (two) times daily. Patient not taking: Reported on 04/02/2016 03/19/16   Kathyrn Drown, MD    Family History Family History  Problem Relation Age of Onset  . Hypertension Father   . Cancer Father     lung  . Dementia Mother   . Diabetes Mother   . Hypertension Brother     Social History Social History  Substance Use Topics  . Smoking status: Never Smoker  . Smokeless tobacco: Never Used  . Alcohol use 0.0 oz/week     Allergies   Penicillins and Levaquin [levofloxacin]   Review of Systems Review of Systems  Constitutional: Negative for chills and fever.  HENT: Negative for ear pain and sore throat.   Eyes: Negative for pain and visual disturbance.  Respiratory: Negative for cough and shortness of breath.   Cardiovascular: Negative for chest pain and palpitations.  Gastrointestinal: Negative for abdominal pain and vomiting.  Genitourinary: Positive for difficulty urinating and flank pain. Negative for dysuria and hematuria.  Musculoskeletal: Negative for arthralgias and back pain.  Skin: Negative for color change and rash.  Neurological: Negative for seizures and syncope.  All other systems reviewed and are negative.    Physical Exam Updated Vital Signs BP 132/88 (BP Location: Left  Arm)   Pulse 65   Temp 98 F (36.7 C) (Oral)   Resp 18   Ht 6\' 1"  (1.854 m)   Wt 225 lb (102.1 kg)   SpO2 97%   BMI 29.69 kg/m   Physical Exam  Constitutional: He is oriented to person, place, and time. He appears well-developed and well-nourished. No distress.  HENT:  Head: Normocephalic and atraumatic.  Nose: Nose normal.  Eyes: Conjunctivae and EOM are normal. Pupils are equal, round, and reactive to light. Right eye exhibits no discharge. Left eye exhibits no discharge. No scleral  icterus.  Neck: Normal range of motion. Neck supple.  Cardiovascular: Normal rate and regular rhythm.  Exam reveals no gallop and no friction rub.   No murmur heard. Pulmonary/Chest: Effort normal and breath sounds normal. No stridor. No respiratory distress. He has no rales.  Abdominal: Soft. He exhibits no distension. There is no tenderness. There is CVA tenderness (right). There is no rigidity, no rebound, no guarding and negative Murphy's sign.  Musculoskeletal: He exhibits no edema or tenderness.  Neurological: He is alert and oriented to person, place, and time.  Skin: Skin is warm and dry. No rash noted. He is not diaphoretic. No erythema.  Psychiatric: He has a normal mood and affect.  Vitals reviewed.    ED Treatments / Results  Labs (all labs ordered are listed, but only abnormal results are displayed) Labs Reviewed  URINALYSIS, ROUTINE W REFLEX MICROSCOPIC (NOT AT [facility]) - Abnormal; Notable for the following:       Result Value   Ketones, ur 15 (*)    All other components within normal limits  CBC WITH DIFFERENTIAL/PLATELET - Abnormal; Notable for the following:    WBC 11.7 (*)    Neutro Abs 9.9 (*)    All other components within normal limits  BASIC METABOLIC PANEL - Abnormal; Notable for the following:    Sodium 134 (*)    Chloride 100 (*)    Glucose, Bld 107 (*)    Creatinine, Ser 1.42 (*)    GFR calc non Af Amer 53 (*)    All other components within normal limits    EKG  EKG Interpretation None       Radiology US Renal  Result Date: 04/02/2016 CLINICAL DATA:  Right flank pain. History of right hydronephrosis with known obstructing right pelvic adenopathy. EXAM: RENAL / URINARY TRACT ULTRASOUND COMPLETE COMPARISON:  04/01/2016 PET-CT. FINDINGS: Right Kidney: Length: 15.1 cm. Mildly echogenic right kidney. Mild to moderate right hydronephrosis. No right renal mass. Left Kidney: Length: 14.4 cm. Mildly echogenic left kidney. No left hydronephrosis. Cystic  structures in the lower left renal pelvis represent parapelvic renal cysts as seen on 03/22/2016 delayed CT abdomen images. No left renal mass. Bladder: Appears normal for degree of bladder distention. No right ureteral jet. Normal left ureteral jet. IMPRESSION: 1. Mild to moderate right hydronephrosis, stable since PET-CT from 1 day prior. Absence of right ureteral jet. Findings are consistent with right ureteral obstruction by known infiltrative right pelvic adenopathy. 2. No left hydronephrosis.  Left parapelvic renal cysts. 3. Mildly echogenic kidneys, indicating nonspecific renal parenchymal disease of uncertain chronicity . 4. Normal bladder. Electronically Signed   By: Ilona Sorrel M.D.   On: 04/02/2016 14:37   Nm Pet Image Initial (pi) Skull Base To Thigh  Result Date: 04/01/2016 CLINICAL DATA:  Initial treatment strategy for mesenteric, retroperitoneal and bilateral pelvic lymphadenopathy seen on recent CT abdomen/pelvis study. EXAM: NUCLEAR MEDICINE PET SKULL BASE  TO THIGH TECHNIQUE: 11.3 mCi F-18 FDG was injected intravenously. Full-ring PET imaging was performed from the skull base to thigh after the radiotracer. CT data was obtained and used for attenuation correction and anatomic localization. FASTING BLOOD GLUCOSE:  Value: 87 mg/dl COMPARISON:  03/22/2016 CT abdomen/pelvis.  05/27/2015 chest CT. FINDINGS: NECK No hypermetabolic lymph nodes in the neck. CHEST No hypermetabolic axillary, mediastinal or hilar nodes. No pleural effusions. No acute consolidative airspace disease, lung masses or significant pulmonary nodules. ABDOMEN/PELVIS Intensely hypermetabolic conglomerate left mesenteric node measuring 2.7 cm short axis with max SUV 33.9 (series 4/image 154). There is intensely hypermetabolic confluent bulky adenopathy in the lower aortocaval region extending into the bilateral common iliac region, measuring 3.4 cm short axis with max SUV 36.2 (series 4/image 155). There is intensely  hypermetabolic infiltrative soft tissue throughout the bilateral extraperitoneal pelvis and less prominently in the bilateral retroperitoneum, with associated confluent adenopathy in the right common / external / internal iliac chains with max SUV 35.4 with largest short axis diameter 3.4 cm (series 4/image 163). There is associated stable mild right hydroureteronephrosis to the level of the confluent right common iliac adenopathy. Right deep pelvic sidewall infiltrative soft tissue measures up to the 1.6 cm thickness and demonstrates max SUV 26.6 (series 4/image 192). Right retrocaval region infiltrative soft tissue measures up to 0.7 cm thickness in demonstrates max SUV 13.9 (series 4/ image 125). Presacral space infiltrative soft tissue measures up to 1.0 cm thickness and demonstrates max SUV 15.6 (series 4/image 190). Left external iliac region infiltrative soft tissue measures up to 01.5 cm thickness and demonstrates max SUV 25.2 (series 4/image 188). No abnormal hypermetabolic activity within the liver, pancreas, adrenal glands, or spleen. Top-normal size prostate with nonspecific internal prostatic calcifications. No left hydronephrosis. Stable left parapelvic renal cysts. SKELETON No focal hypermetabolic activity to suggest skeletal metastasis. IMPRESSION: 1. Intensely hypermetabolic bulky left mesenteric, aortocaval, bilateral common iliac and right external/internal iliac adenopathy. Intensely hypermetabolic infiltrative soft tissue throughout the bilateral extraperitoneal pelvis and less prominently in the bilateral retroperitoneum. 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 2. No hypermetabolic disease in the neck, chest or skeleton. 3. Stable mild right hydroureteronephrosis to the level of the bulky right common iliac adenopathy. Electronically Signed   By: Ilona Sorrel M.D.   On: 04/01/2016 15:14    Procedures Procedures (including critical care time)  Medications Ordered in  ED Medications  HYDROmorphone (DILAUDID) injection 1 mg (1 mg Intravenous Given 04/02/16 1247)  HYDROmorphone (DILAUDID) injection 1 mg (1 mg Intravenous Given 04/02/16 1522)  ketorolac (TORADOL) 15 MG/ML injection 15 mg (15 mg Intravenous Given 04/02/16 1701)     Initial Impression / Assessment and Plan / ED Course  I have reviewed the triage vital signs and the nursing notes.  Pertinent labs & imaging results that were available during my care of the patient were reviewed by me and considered in my medical decision making (see chart for details).  Clinical Course    Renal ultrasound with stable hydronephrosis however no right ureteral jets confirming obstruction. Mild decrease in renal dysfunction.   Patient provided with several doses of IV pain medicine with minimal improvement. Urology consult and who assessed the patient in the emergency department and will take to the [facility] for stenting.    Final Clinical Impressions(s) / ED Diagnoses   Final diagnoses:  Urinary retention  Renal insufficiency  Hydronephrosis with ureteral stricture, not elsewhere classified  Right flank pain      Shellee Milo  Aretha Parrot, MD 04/02/16 [phone]

## 2016-04-02 NOTE — Anesthesia Preprocedure Evaluation (Signed)
Anesthesia Evaluation  Patient identified by MRN, date of birth, ID band Patient awake    Reviewed: Allergy & Precautions, NPO status , Patient's Chart, lab work & pertinent test results  Airway Mallampati: I  TM Distance: >3 FB Neck ROM: Full    Dental  (+) Teeth Intact, Dental Advisory Given   Pulmonary  breath sounds clear to auscultation        Cardiovascular Rhythm:Regular Rate:Normal     Neuro/Psych    GI/Hepatic   Endo/Other    Renal/GU      Musculoskeletal   Abdominal   Peds  Hematology   Anesthesia Other Findings   Reproductive/Obstetrics                             Anesthesia Physical Anesthesia Plan  ASA: II  Anesthesia Plan: General   Post-op Pain Management:    Induction: Intravenous  Airway Management Planned: LMA  Additional Equipment:   Intra-op Plan:   Post-operative Plan: Extubation in OR  Informed Consent: I have reviewed the patients History and Physical, chart, labs and discussed the procedure including the risks, benefits and alternatives for the proposed anesthesia with the patient or authorized representative who has indicated his/her understanding and acceptance.   Dental advisory given  Plan Discussed with: CRNA, Anesthesiologist and Surgeon  Anesthesia Plan Comments:         Anesthesia Quick Evaluation  

## 2016-04-02 NOTE — Consult Note (Signed)
New Urology Consult Note   Requesting Attending Physician:  Fatima Blank, MD Service Providing Consult: Urology Consulting Attending: Matilde Sprang, MD  Assessment:  Patient is a 60 y.o. male with right flank, abdominal, back pain with right sided hydronephrosis in setting of retroperitoneal lymphadenopathy of unknown etiology (suggestive of lymphoma). He was initially scheduled for outpatient cystoscopy, right retrograde pyelogram and right ureteral stent placement on 04/06/16 but has had uncontrolled symptoms despite PO pain meds and Flomax.   Recommendations: 1. Keep NPO 2. We will plan for cystoscopy, right retrograde pyelogram, right ureteral stent placement tonight 3. Admit for OBS vs discharge home after surgery pending intraop findings and how he recovers in PACU  Thank you for this consult. Please contact the urology consult pager with any further questions/concerns. Sharmaine Base, MD Urology Surgical Resident  I performed a history and physical examination of the patient and discussed his management with the resident.  I reviewed the resident's note and agree with the documented findings and plan of care. Patient and I spoke in detail re stent; and follow-up  Pros and cons and risks and sequelae discussed  Patient was non toxic and on severe CVA tenderness     HPI -   Reason for Consult:  Flank pain  John Marquez is seen in consultation for reasons noted above at the request of Fatima Blank, MD.  This is a 60 yo patient with a history of retroperitoneal adenopathy with known right sided hydronephrosis recently seen by Dr. Jeffie Pollock on 03/25/16. He was set up for cystoscopy, right retrograde pyelogram and ureteral stent placement during that visit but has yet to have that procedure performed (originally scheduled for 10/3). He came to [facility] ER today with urinary retention, 8/10 pain. CT scan on 9/18 showed right hydronephrosis 2/2 extrinsic compression and  'probable partial obstruction of left ureter' but no hydronephrosis on the left. He had a PET scan yesterday showing hypermetabolic nodes in the left mesenteric, aortocaval, bilateral common iliac & external/internal iliac regions, most suggestive for lymphoma. Has known left parapelvic renal cysts.  Tells me that his right sided abdominal/flank/back pain brought him here today. His pain has typically been occurring most at night. He noticed increasing pain with his PET scan during day yesterday and worsened even more overnight. Getting up and walking around may have made his pain better at times but last night it was horrible. Emesis x1 two weeks ago, none since. Had nausea last night. + night sweats, decreased PO appetite. No food today, only had 2 sips of black coffee at 7:30am. Denies fevers, diarrhea. + chills. Feels constipated and last BM was last night. Using Flomax to try to help with pain. Taking ibuprofen as well and even percocet last night, but don't think the percocet changed his pain at all. + mild urinary urgency with decreased UOP and sense of hesitancy, says he will try to urinate for 10-20 minutes and have very little output. Denies gross hematuria, dysuria.   He has been given dilaudid and toradol here in Er. Did not feel much improvement with dilaudid.  AFVSS. Cr is 1.4 from 1.1 on 9/18. WBC 11.7. Urinalysis is not suggestive of infection.    Past Medical History: Past Medical History:  Diagnosis Date  . Allergic rhinitis   . Eczema    Winter  . History of melanoma excision    07-23-2015 and 07-31-2015  mid upper back  . Hydronephrosis, right   . Retroperitoneal lymphadenopathy  Past Surgical History:  Past Surgical History:  Procedure Laterality Date  . COLONOSCOPY  2010    Medication: No current facility-administered medications for this encounter.    Current Outpatient Prescriptions  Medication Sig Dispense Refill  . ibuprofen (ADVIL,MOTRIN) 200 MG tablet  Take 200-800 mg by mouth every 6 (six) hours as needed for moderate pain.    Marland Kitchen oxyCODONE-acetaminophen (PERCOCET/ROXICET) 5-325 MG tablet Take 1 tablet by mouth every 4 (four) hours as needed. (Patient taking differently: Take 1 tablet by mouth every 4 (four) hours as needed for moderate pain. ) 30 tablet 0  . predniSONE (DELTASONE) 20 MG tablet Take 3 tablet by mouth for 3 days, then take 2 tablet by mouth for 3 days, then take 1 tablet by mouth for 2 days. 17 tablet 0  . tamsulosin (FLOMAX) 0.4 MG CAPS capsule Take 1 capsule (0.4 mg total) by mouth daily. 20 capsule 1  . cefPROZIL (CEFZIL) 500 MG tablet Take 1 tablet (500 mg total) by mouth 2 (two) times daily. (Patient not taking: Reported on 04/02/2016) 14 tablet 0    Allergies: Allergies  Allergen Reactions  . Penicillins     Patient can tolerate cephalosporins .Marland KitchenHas patient had a PCN reaction causing immediate rash, facial/tongue/throat swelling, SOB or lightheadedness with hypotension: No Has patient had a PCN reaction causing severe rash involving mucus membranes or skin necrosis: No Has patient had a PCN reaction that required hospitalization No Has patient had a PCN reaction occurring within the last 10 years: No If all of the above answers are "NO", then may proceed with Cephalosporin use.   Mack Hook [Levofloxacin]     Social History: Social History  Substance Use Topics  . Smoking status: Never Smoker  . Smokeless tobacco: Never Used  . Alcohol use 0.0 oz/week    Family History Family History  Problem Relation Age of Onset  . Hypertension Father   . Cancer Father     lung  . Dementia Mother   . Diabetes Mother   . Hypertension Brother     Review of Systems 10 systems were reviewed and are negative except as noted specifically in the HPI.  Objective   Vital signs in last 24 hours: BP 142/81 (BP Location: Left Arm)   Pulse 63   Temp 98 F (36.7 C) (Oral)   Resp 16   Ht 6\' 1"  (1.854 m)   Wt 225 lb (102.1  kg)   SpO2 99%   BMI 29.69 kg/m   Intake/Output last 3 shifts: No intake/output data recorded.  Physical Exam General: NAD, A&O, resting, appears uncomfortable with movement HEENT: [place]/AT, EOMI, MMM Pulmonary: Normal work of breathing on RA Cardiovascular: Regular rate & rhythm, HDS, adequate peripheral perfusion Abdomen: soft, nondistended, suprapubic TTP, RUQ and RLQ TTP with increasing tenderness in the left flank GU: circumcised male phallus, bilateral descended testicles, some mild discomfort on palpation of right testicle but no abnormality noted, moderate to significant CVA tenderness on right, no left CVAT DRE: deferred Extremities: warm and well perfused, no edema Neuro: Appropriate, no focal neurological deficits  Most Recent Labs: Lab Results  Component Value Date   WBC 11.7 (H) 04/02/2016   HGB 15.3 04/02/2016   HCT 43.6 04/02/2016   PLT 325 04/02/2016    Lab Results  Component Value Date   NA 134 (L) 04/02/2016   K 4.1 04/02/2016   CL 100 (L) 04/02/2016   CO2 26 04/02/2016   BUN 20 04/02/2016   CREATININE 1.42 (H)  04/02/2016   CALCIUM 9.1 04/02/2016    Lab Results  Component Value Date   ALKPHOS 70 03/22/2016   BILITOT 0.4 03/22/2016   BILIDIR 0.1 03/22/2016   PROT 7.4 03/22/2016   ALBUMIN 4.6 03/22/2016   ALT 22 03/22/2016   AST 21 03/22/2016    No results found for: INR, APTT  IMAGING: US Renal  Result Date: 04/02/2016 CLINICAL DATA:  Right flank pain. History of right hydronephrosis with known obstructing right pelvic adenopathy. EXAM: RENAL / URINARY TRACT ULTRASOUND COMPLETE COMPARISON:  04/01/2016 PET-CT. FINDINGS: Right Kidney: Length: 15.1 cm. Mildly echogenic right kidney. Mild to moderate right hydronephrosis. No right renal mass. Left Kidney: Length: 14.4 cm. Mildly echogenic left kidney. No left hydronephrosis. Cystic structures in the lower left renal pelvis represent parapelvic renal cysts as seen on 03/22/2016 delayed CT abdomen  images. No left renal mass. Bladder: Appears normal for degree of bladder distention. No right ureteral jet. Normal left ureteral jet. IMPRESSION: 1. Mild to moderate right hydronephrosis, stable since PET-CT from 1 day prior. Absence of right ureteral jet. Findings are consistent with right ureteral obstruction by known infiltrative right pelvic adenopathy. 2. No left hydronephrosis.  Left parapelvic renal cysts. 3. Mildly echogenic kidneys, indicating nonspecific renal parenchymal disease of uncertain chronicity . 4. Normal bladder. Electronically Signed   By: Ilona Sorrel M.D.   On: 04/02/2016 14:37   Nm Pet Image Initial (pi) Skull Base To Thigh  Result Date: 04/01/2016 CLINICAL DATA:  Initial treatment strategy for mesenteric, retroperitoneal and bilateral pelvic lymphadenopathy seen on recent CT abdomen/pelvis study. EXAM: NUCLEAR MEDICINE PET SKULL BASE TO THIGH TECHNIQUE: 11.3 mCi F-18 FDG was injected intravenously. Full-ring PET imaging was performed from the skull base to thigh after the radiotracer. CT data was obtained and used for attenuation correction and anatomic localization. FASTING BLOOD GLUCOSE:  Value: 87 mg/dl COMPARISON:  03/22/2016 CT abdomen/pelvis.  05/27/2015 chest CT. FINDINGS: NECK No hypermetabolic lymph nodes in the neck. CHEST No hypermetabolic axillary, mediastinal or hilar nodes. No pleural effusions. No acute consolidative airspace disease, lung masses or significant pulmonary nodules. ABDOMEN/PELVIS Intensely hypermetabolic conglomerate left mesenteric node measuring 2.7 cm short axis with max SUV 33.9 (series 4/image 154). There is intensely hypermetabolic confluent bulky adenopathy in the lower aortocaval region extending into the bilateral common iliac region, measuring 3.4 cm short axis with max SUV 36.2 (series 4/image 155). There is intensely hypermetabolic infiltrative soft tissue throughout the bilateral extraperitoneal pelvis and less prominently in the bilateral  retroperitoneum, with associated confluent adenopathy in the right common / external / internal iliac chains with max SUV 35.4 with largest short axis diameter 3.4 cm (series 4/image 163). There is associated stable mild right hydroureteronephrosis to the level of the confluent right common iliac adenopathy. Right deep pelvic sidewall infiltrative soft tissue measures up to the 1.6 cm thickness and demonstrates max SUV 26.6 (series 4/image 192). Right retrocaval region infiltrative soft tissue measures up to 0.7 cm thickness in demonstrates max SUV 13.9 (series 4/ image 125). Presacral space infiltrative soft tissue measures up to 1.0 cm thickness and demonstrates max SUV 15.6 (series 4/image 190). Left external iliac region infiltrative soft tissue measures up to 01.5 cm thickness and demonstrates max SUV 25.2 (series 4/image 188). No abnormal hypermetabolic activity within the liver, pancreas, adrenal glands, or spleen. Top-normal size prostate with nonspecific internal prostatic calcifications. No left hydronephrosis. Stable left parapelvic renal cysts. SKELETON No focal hypermetabolic activity to suggest skeletal metastasis. IMPRESSION: 1. Intensely hypermetabolic bulky  left mesenteric, aortocaval, bilateral common iliac and right external/internal iliac adenopathy. Intensely hypermetabolic infiltrative soft tissue throughout the bilateral extraperitoneal pelvis and less prominently in the bilateral retroperitoneum. 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 2. No hypermetabolic disease in the neck, chest or skeleton. 3. Stable mild right hydroureteronephrosis to the level of the bulky right common iliac adenopathy. Electronically Signed   By: Ilona Sorrel M.D.   On: 04/01/2016 15:14

## 2016-04-02 NOTE — Anesthesia Procedure Notes (Signed)
Procedure Name: LMA Insertion Date/Time: 04/02/2016 6:32 PM Performed by: Noralyn Pick D Pre-anesthesia Checklist: Patient identified, Emergency Drugs available, Suction available and Patient being monitored Patient Re-evaluated:Patient Re-evaluated prior to inductionOxygen Delivery Method: Circle system utilized Preoxygenation: Pre-oxygenation with 100% oxygen Intubation Type: IV induction Ventilation: Mask ventilation without difficulty LMA Size: 4.0 Tube type: Oral Number of attempts: 1 Placement Confirmation: positive ETCO2 and breath sounds checked- equal and bilateral Tube secured with: Tape Dental Injury: Teeth and Oropharynx as per pre-operative assessment

## 2016-04-02 NOTE — Discharge Instructions (Signed)
°  Ureteral Stent Implantation, Care After Refer to this sheet in the next few weeks. These instructions provide you with information on caring for yourself after your procedure. Your health care provider may also give you more specific instructions. Your treatment has been planned according to current medical practices, but problems sometimes occur. Call your health care provider if you have any problems or questions after your procedure. WHAT TO EXPECT AFTER THE PROCEDURE You should be back to normal activity within 48 hours after the procedure. Nausea and vomiting may occur and are commonly the result of anesthesia. It is common to experience sharp pain in the back or lower abdomen and penis with voiding. This is caused by movement of the ends of the stent with the act of urinating.It usually goes away within minutes after you have stopped urinating. HOME CARE INSTRUCTIONS Make sure to drink plenty of fluids. You may have small amounts of bleeding, causing your urine to be red. This is normal. Certain movements may trigger pain or a feeling that you need to urinate. You may be given medicines to prevent infection or bladder spasms. Be sure to take all medicines as directed. Only take over-the-counter or prescription medicines for pain, discomfort, or fever as directed by your health care provider. Do not take aspirin, as this can make bleeding worse. Your stent will be left in until the blockage is resolved. This may take 2 weeks or longer, depending on the reason for stent implantation. You may have an X-ray exam to make sure your ureter is open and that the stent has not moved out of position (migrated). The stent can be removed by your health care provider in the office. Medicines may be given for comfort while the stent is being removed. Be sure to keep all follow-up appointments so your health care provider can check that you are healing properly. SEEK MEDICAL CARE IF:  You experience increasing  pain.  Your pain medicine is not working. SEEK IMMEDIATE MEDICAL CARE IF:  Your urine is dark red or has blood clots.  You are leaking urine (incontinent).  You have a fever, chills, feeling sick to your stomach (nausea), or vomiting.  Your pain is not relieved by pain medicine.  The end of the stent comes out of the urethra.  You are unable to urinate.   This information is not intended to replace advice given to you by your health care provider. Make sure you discuss any questions you have with your health care provider.   Document Released: 02/21/2013 Document Revised: 06/26/2013 Document Reviewed: 01/03/2015 Elsevier Interactive Patient Education 2016 [place] [facility] Home Care Instructions  Activity: Get plenty of rest for the remainder of the day. A responsible adult should stay with you for 24 hours following the procedure.  For the next 24 hours, DO NOT: -Drive a car -Paediatric nurse -Drink alcoholic beverages -Take any medication unless instructed by your physician -Make any legal decisions or sign important papers.  Meals: Start with liquid foods such as gelatin or soup. Progress to regular foods as tolerated. Avoid greasy, spicy, heavy foods. If nausea and/or vomiting occur, drink only clear liquids until the nausea and/or vomiting subsides. Call your physician if vomiting continues.  Special Instructions/Symptoms: Your throat may feel dry or sore from the anesthesia or the breathing tube placed in your throat during surgery. If this causes discomfort, gargle with warm salt water. The discomfort should disappear within 24 hours.

## 2016-04-02 NOTE — Transfer of Care (Signed)
Immediate Anesthesia Transfer of Care Note  Patient: John Marquez  Procedure(s) Performed: Procedure(s): CYSTOSCOPY WITH RIGHT RETROGRADE PYELOGRAM AND STENT PLACEMENT (Right)  Patient Location: PACU  Anesthesia Type:General  Level of Consciousness: awake, alert  and oriented  Airway & Oxygen Therapy: Patient Spontanous Breathing and Patient connected to face mask oxygen  Post-op Assessment: Report given to RN and Post -op Vital signs reviewed and stable  Post vital signs: Reviewed and stable  Last Vitals:  Vitals:   04/02/16 1759 04/02/16 1900  BP: 133/87   Pulse: 71   Resp: 16   Temp:  (P) 36.6 C    Last Pain:  Vitals:   04/02/16 1702  TempSrc:   PainSc: 7          Complications: No apparent anesthesia complications

## 2016-04-02 NOTE — Op Note (Signed)
Preoperative Diagnosis: Right hydronephrosis  Postoperative Diagnosis:  Right mild hydronephrosis, tortuosity of right ureter  Procedure(s) Performed:   1. Cystourethroscopy 2. Right ureteral stent placement 3. Right retrograde pyelogram 4. Intraoperative fluoroscopy with interpretation <1hr.   Teaching Surgeon:  Bjorn Loser, MD  Resident Surgeon:  Sharmaine Base, MD  Assistant(s):  None  Anesthesia:  General via LMA  Fluids:  See anesthesia record  Estimated blood loss:  <5 mL  Specimens:  None  Cultures:  None  Drains:  Right 75fr x 26cm JJ ureteral stent without dangler  Complications:  None  Indications: 60yo male with retroperitoneal adenopathy with known right sided obstructive uropathy, presented to ER with uncontrolled pain despite PO PRN options. He was already on schedule for stent placement as an outpatient next week. After discussion with the patient we have elected to proceed with cystoscopy, right ureteral stent placement and retrograde pyelogram this evening. The risks & benefits were again discussed with the patient.  Findings:  Normal cystourethroscopy. No bladder tumors, mucosal irregularities, foreign bodies, stones. Successful placement of 67fr x 26cm JJ ureteral stent without dangler. Somewhat redundant curl noted proximally, could consider downsizing to 24cm stent in future if patient symptomatic from stent.  Radiologic Interpretation of Retrograde Pyelogram -  Right retrograde pyelogram: There was some medial deviation of the mid ureter and tortuosity of the mid and proximal ureter however there was not much significant hydroureter. There was caliectasis on the right with mild hydronephrosis. No contrast extravasation nor filling defect.  Description:  The patient was correctly identified in the preop holding area where written informed consent as well potential risk and complication reviewed. He agreed. They were brought back to the operative suite where  a preinduction timeout was performed. Once correct information was verified, general anesthesia was induced via LMA. They were then gently placed into dorsal lithotomy position with SCDs in place for VTE prophylaxis. They were prepped and draped in the usual sterile fashion and given appropriate preoperative antibiotics with Clindamycin. A second timeout was then performed.   We inserted a 4F rigid cystoscope per urethra with copious lubrication and normal saline irrigation running. This demonstrated findings as above.  We turned our attention to the right ureteral orifice which we cannulated with the combination of a sensor wire and a 4fr open ended catheter. The open ended catheter was advanced into the distal right ureter about 5cm and a retrograde pyelogram was performed with findings as above. The sensor wire was then advanced into the right kidney under fluoroscopic guidance without difficulty. The open ended catheter was removed.  We then advanced a 26fr x 26cm JJ ureteral stent without dangler over the sensor wire with the assistance of a stent pusher under direct fluoroscopic and visual guidance without difficulty. Sensor wire removal demonstrated satisfactory stent curl proximally within the right renal pelvis (somewhat redundant curl) as well as distally within the bladder. The bladder was emptied and all instrumentation was removed. Viscous lidocaine jelly was instilled in the urethra. The patient was woken up from anesthesia and taken to recovery for routine postoperative care.   Post Op Plan:   1. Discharge patient home when meets PACU criteria and voids x1. 2. Discharge with Rx's for dilaudid Po, flomax, oxybutynin, pyridium 3. We will arrange follow up in the [facility]  Attestation:  Dr. Matilde Sprang was present for the entire procedure.  I assisted directly in the entire procedure.   Sharmaine Base, MD Resident, Department of Urology

## 2016-04-04 NOTE — Anesthesia Postprocedure Evaluation (Signed)
Anesthesia Post Note  Patient: John Marquez  Procedure(s) Performed: Procedure(s) (LRB): CYSTOSCOPY WITH RIGHT RETROGRADE PYELOGRAM AND STENT PLACEMENT (Right)  Patient location during evaluation: PACU Anesthesia Type: General Level of consciousness: awake and alert Pain management: pain level controlled Vital Signs Assessment: post-procedure vital signs reviewed and stable Respiratory status: spontaneous breathing, nonlabored ventilation and respiratory function stable Cardiovascular status: blood pressure returned to baseline and stable Postop Assessment: no signs of nausea or vomiting Anesthetic complications: no    Last Vitals:  Vitals:   04/02/16 1945 04/02/16 2000  BP: 114/68 137/79  Pulse: 78 78  Resp: 17 16  Temp:  36.6 C    Last Pain:  Vitals:   04/02/16 1915  TempSrc:   PainSc: Asleep                 Hillary Schwegler A

## 2016-04-05 ENCOUNTER — Other Ambulatory Visit: Payer: Self-pay | Admitting: *Deleted

## 2016-04-05 ENCOUNTER — Other Ambulatory Visit: Payer: Self-pay | Admitting: Radiology

## 2016-04-05 ENCOUNTER — Other Ambulatory Visit: Payer: Self-pay | Admitting: Family Medicine

## 2016-04-05 ENCOUNTER — Encounter (HOSPITAL_COMMUNITY): Payer: Self-pay | Admitting: Urology

## 2016-04-05 DIAGNOSIS — R319 Hematuria, unspecified: Secondary | ICD-10-CM

## 2016-04-05 LAB — POCT URINALYSIS DIPSTICK
PROTEIN UA: 30
RBC UA: 250
Spec Grav, UA: 1.02
pH, UA: 5

## 2016-04-05 NOTE — Telephone Encounter (Signed)
Pt to come by today and give urine sample for u/A and culture

## 2016-04-05 NOTE — Telephone Encounter (Signed)
Dr scott spoke with pt today 

## 2016-04-05 NOTE — Telephone Encounter (Signed)
Urine sent for culture. U/A results put in and urine ready to look at under the microscope.

## 2016-04-06 ENCOUNTER — Encounter (HOSPITAL_BASED_OUTPATIENT_CLINIC_OR_DEPARTMENT_OTHER): Payer: Self-pay

## 2016-04-06 ENCOUNTER — Other Ambulatory Visit: Payer: Self-pay | Admitting: Radiology

## 2016-04-06 ENCOUNTER — Ambulatory Visit (HOSPITAL_BASED_OUTPATIENT_CLINIC_OR_DEPARTMENT_OTHER): Admit: 2016-04-06 | Payer: 59 | Admitting: Urology

## 2016-04-06 HISTORY — DX: Localized enlarged lymph nodes: R59.0

## 2016-04-06 HISTORY — DX: Other specified postprocedural states: Z98.890

## 2016-04-06 HISTORY — DX: Unspecified hydronephrosis: N13.30

## 2016-04-06 HISTORY — DX: Other specified postprocedural states: Z85.820

## 2016-04-06 SURGERY — CYSTOURETEROSCOPY, WITH RETROGRADE PYELOGRAM AND STENT INSERTION
Anesthesia: General | Laterality: Right

## 2016-04-06 NOTE — Telephone Encounter (Signed)
UA with RBCs no WBCs culture sent patient spoken with

## 2016-04-07 ENCOUNTER — Ambulatory Visit (HOSPITAL_COMMUNITY)
Admission: RE | Admit: 2016-04-07 | Discharge: 2016-04-07 | Disposition: A | Discharge status: Home / Self Care | Payer: 59 | Source: Ambulatory Visit | Attending: Hematology & Oncology | Admitting: Hematology & Oncology

## 2016-04-07 ENCOUNTER — Encounter (HOSPITAL_COMMUNITY): Payer: Self-pay

## 2016-04-07 DIAGNOSIS — C8330 Diffuse large B-cell lymphoma, unspecified site: Secondary | ICD-10-CM | POA: Diagnosis present

## 2016-04-07 DIAGNOSIS — Z85820 Personal history of malignant melanoma of skin: Secondary | ICD-10-CM | POA: Insufficient documentation

## 2016-04-07 DIAGNOSIS — R590 Localized enlarged lymph nodes: Secondary | ICD-10-CM

## 2016-04-07 DIAGNOSIS — N281 Cyst of kidney, acquired: Secondary | ICD-10-CM | POA: Insufficient documentation

## 2016-04-07 DIAGNOSIS — N1330 Unspecified hydronephrosis: Secondary | ICD-10-CM | POA: Diagnosis not present

## 2016-04-07 DIAGNOSIS — J309 Allergic rhinitis, unspecified: Secondary | ICD-10-CM | POA: Diagnosis not present

## 2016-04-07 DIAGNOSIS — N3289 Other specified disorders of bladder: Secondary | ICD-10-CM | POA: Diagnosis not present

## 2016-04-07 LAB — CBC WITH DIFFERENTIAL/PLATELET
BASOS PCT: 0 %
Basophils Absolute: 0 10*3/uL (ref 0.0–0.1)
EOS ABS: 0.3 10*3/uL (ref 0.0–0.7)
Eosinophils Relative: 3 %
HEMATOCRIT: 43.7 % (ref 39.0–52.0)
HEMOGLOBIN: 15.4 g/dL (ref 13.0–17.0)
LYMPHS ABS: 1.1 10*3/uL (ref 0.7–4.0)
Lymphocytes Relative: 11 %
MCH: 29.6 pg (ref 26.0–34.0)
MCHC: 35.2 g/dL (ref 30.0–36.0)
MCV: 84 fL (ref 78.0–100.0)
MONO ABS: 1.1 10*3/uL — AB (ref 0.1–1.0)
MONOS PCT: 11 %
NEUTROS ABS: 7.4 10*3/uL (ref 1.7–7.7)
Neutrophils Relative %: 75 %
Platelets: 302 10*3/uL (ref 150–400)
RBC: 5.2 MIL/uL (ref 4.22–5.81)
RDW: 13 % (ref 11.5–15.5)
WBC: 9.9 10*3/uL (ref 4.0–10.5)

## 2016-04-07 LAB — URINE CULTURE: ORGANISM ID, BACTERIA: NO GROWTH

## 2016-04-07 LAB — COMPREHENSIVE METABOLIC PANEL
ALBUMIN: 4 g/dL (ref 3.5–5.0)
ALK PHOS: 69 U/L (ref 38–126)
ALT: 20 U/L (ref 17–63)
ANION GAP: 7 (ref 5–15)
AST: 17 U/L (ref 15–41)
BILIRUBIN TOTAL: 0.8 mg/dL (ref 0.3–1.2)
BUN: 19 mg/dL (ref 6–20)
CALCIUM: 9.6 mg/dL (ref 8.9–10.3)
CO2: 26 mmol/L (ref 22–32)
Chloride: 106 mmol/L (ref 101–111)
Creatinine, Ser: 0.96 mg/dL (ref 0.61–1.24)
GFR calc non Af Amer: >60 mL/min (ref >60)
Glucose, Bld: 121 mg/dL — ABNORMAL HIGH (ref 65–99)
POTASSIUM: 4.2 mmol/L (ref 3.5–5.1)
Sodium: 139 mmol/L (ref 135–145)
TOTAL PROTEIN: 7 g/dL (ref 6.5–8.1)

## 2016-04-07 LAB — PROTIME-INR
INR: 0.92
PROTHROMBIN TIME: 12.4 s (ref 11.4–15.2)

## 2016-04-07 MED ORDER — MIDAZOLAM HCL 2 MG/2ML IJ SOLN
INTRAMUSCULAR | Status: DC | PRN
  Administered 2016-04-07 (×3): 1 mg via INTRAVENOUS

## 2016-04-07 MED ORDER — HYDROMORPHONE HCL 2 MG/ML IJ SOLN
INTRAMUSCULAR | Status: AC
  Filled 2016-04-07: qty 1

## 2016-04-07 MED ORDER — FENTANYL CITRATE (PF) 100 MCG/2ML IJ SOLN
INTRAMUSCULAR | Status: AC
  Filled 2016-04-07: qty 4

## 2016-04-07 MED ORDER — MIDAZOLAM HCL 2 MG/2ML IJ SOLN
INTRAMUSCULAR | Status: AC
  Filled 2016-04-07: qty 6

## 2016-04-07 MED ORDER — HYDROMORPHONE HCL 1 MG/ML IJ SOLN
INTRAMUSCULAR | Status: DC | PRN
  Administered 2016-04-07: 1 mg via INTRAVENOUS

## 2016-04-07 MED ORDER — HYDROMORPHONE HCL 2 MG/ML IJ SOLN
1.0000 mg | INTRAMUSCULAR | Status: AC
Start: 2016-04-07 — End: 2016-04-07
  Administered 2016-04-07: 1 mg via INTRAVENOUS
  Filled 2016-04-07: qty 1

## 2016-04-07 MED ORDER — HYDROCODONE-ACETAMINOPHEN 5-325 MG PO TABS: 1.0000 | ORAL_TABLET | ORAL | Status: DC | PRN

## 2016-04-07 MED ORDER — FENTANYL CITRATE (PF) 100 MCG/2ML IJ SOLN
INTRAMUSCULAR | Status: DC | PRN
  Administered 2016-04-07: 50 ug via INTRAVENOUS

## 2016-04-07 MED ORDER — SODIUM CHLORIDE 0.9 % IV SOLN
INTRAVENOUS | Status: DC
  Administered 2016-04-07: 08:00:00 via INTRAVENOUS

## 2016-04-07 MED ORDER — OXYCODONE-ACETAMINOPHEN 5-325 MG PO TABS
2.0000 | ORAL_TABLET | Freq: Once | ORAL | Status: AC
  Administered 2016-04-07: 2 via ORAL
  Filled 2016-04-07: qty 2

## 2016-04-07 NOTE — Procedures (Signed)
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 See complete dictation in [facility].

## 2016-04-07 NOTE — Discharge Instructions (Signed)
Needle Biopsy, Care After °These instructions give you information about caring for yourself after your procedure. Your doctor may also give you more specific instructions. Call your doctor if you have any problems or questions after your procedure. °HOME CARE °· Rest as told by your doctor. °· Take medicines only as told by your doctor. °· There are many different ways to close and cover the biopsy site, including stitches (sutures), skin glue, and adhesive strips. Follow instructions from your doctor about: °¨ How to take care of your biopsy site. °¨ When and how you should change your bandage (dressing). °¨ When you should remove your dressing. °¨ Removing whatever was used to close your biopsy site. °· Check your biopsy site every day for signs of infection. Watch for: °¨ Redness, swelling, or pain. °¨ Fluid, blood, or pus. °GET HELP IF: °· You have a fever. °· You have redness, swelling, or pain at the biopsy site, and it lasts longer than a few days. °· You have fluid, blood, or pus coming from the biopsy site. °· You feel sick to your stomach (nauseous). °· You throw up (vomit). °GET HELP RIGHT AWAY IF: °· You are short of breath. °· You have trouble breathing. °· Your chest hurts. °· You feel dizzy or you pass out (faint). °· You have bleeding that does not stop with pressure or a bandage. °· You cough up blood. °· Your belly (abdomen) hurts. °  °This information is not intended to replace advice given to you by your health care provider. Make sure you discuss any questions you have with your health care provider. °  °Document Released: 06/03/2008 Document Revised: 11/05/2014 Document Reviewed: 06/17/2014 °Elsevier Interactive Patient Education ©2016 [employer] °Moderate Conscious Sedation, Adult, Care After °Refer to this sheet in the next few weeks. These instructions provide you with information on caring for yourself after your procedure. Your health care provider may also give you more specific  instructions. Your treatment has been planned according to current medical practices, but problems sometimes occur. Call your health care provider if you have any problems or questions after your procedure. °WHAT TO EXPECT AFTER THE PROCEDURE  °After your procedure: °· You may feel sleepy, clumsy, and have poor balance for several hours. °· Vomiting may occur if you eat too soon after the procedure. °HOME CARE INSTRUCTIONS °· Do not participate in any activities where you could become injured for at least 24 hours. Do not: °· Drive. °· Swim. °· Ride a bicycle. °· Operate heavy machinery. °· Cook. °· Use power tools. °· Climb ladders. °· Work from a high place. °· Do not make important decisions or sign legal documents until you are improved. °· If you vomit, drink water, juice, or soup when you can drink without vomiting. Make sure you have little or no nausea before eating solid foods. °· Only take over-the-counter or prescription medicines for pain, discomfort, or fever as directed by your health care provider. °· Make sure you and your family fully understand everything about the medicines given to you, including what side effects may occur. °· You should not drink alcohol, take sleeping pills, or take medicines that cause drowsiness for at least 24 hours. °· If you smoke, do not smoke without supervision. °· If you are feeling better, you may resume normal activities 24 hours after you were sedated. °· Keep all appointments with your health care provider. °SEEK MEDICAL CARE IF: °· Your skin is pale or bluish in color. °· You   continue to feel nauseous or vomit. °· Your pain is getting worse and is not helped by medicine. °· You have bleeding or swelling. °· You are still sleepy or feeling clumsy after 24 hours. °SEEK IMMEDIATE MEDICAL CARE IF: °· You develop a rash. °· You have difficulty breathing. °· You develop any type of allergic problem. °· You have a fever. °MAKE SURE YOU: °· Understand these  instructions. °· Will watch your condition. °· Will get help right away if you are not doing well or get worse. °  °This information is not intended to replace advice given to you by your health care provider. Make sure you discuss any questions you have with your health care provider. °  °Document Released: 04/11/2013 Document Revised: 07/12/2014 Document Reviewed: 04/11/2013 °Elsevier Interactive Patient Education ©2016 [employer] ° °

## 2016-04-07 NOTE — Consult Note (Signed)
Chief Complaint: Patient was seen in consultation today for image guided biopsy of pelvic/retroperitoneal lymphadenopathy  Referring Physician(s): Penland,Shannon K  Supervising Physician: Arne Cleveland  Patient Status: Outpatient  History of Present Illness: John Marquez is a 60 y.o. male with prior history of melanoma and 2- 3 week history of flank/back pain and subsequent imaging which has revealed hypermetabolic ill-defined soft tissue masses and enlarged lymph nodes in the mesenteric and retroperitoneum worrisome for lymphoma. There was also right-sided hydronephrosis due to involvement of the right ureter by the retroperitoneal  process/partial obstruction of the left ureter. He is status post right ureteral stent placement on 04/02/16 by Dr. McDiarmid. He presents today for image guided biopsy of the pelvic/retroperitoneal lymphadenopathy for further evaluation.  Past Medical History:  Diagnosis Date  . Allergic rhinitis   . Eczema    Winter  . History of melanoma excision    07-23-2015 and 07-31-2015  mid upper back  . Hydronephrosis, right   . Retroperitoneal lymphadenopathy     Past Surgical History:  Procedure Laterality Date  . COLONOSCOPY  2010  . CYSTOSCOPY WITH STENT PLACEMENT Right 04/02/2016   Procedure: CYSTOSCOPY WITH RIGHT RETROGRADE PYELOGRAM AND STENT PLACEMENT;  Surgeon: Bjorn Loser, MD;  Location: [facility];  Service: Urology;  Laterality: Right;    Allergies: Penicillins and Levaquin [levofloxacin]  Medications: Prior to Admission medications   Medication Sig Start Date End Date Taking? Authorizing Provider  ibuprofen (ADVIL,MOTRIN) 200 MG tablet Take 200-800 mg by mouth every 6 (six) hours as needed for moderate pain.   Yes Historical Provider, MD  oxyCODONE-acetaminophen (PERCOCET/ROXICET) 5-325 MG tablet Take by mouth every 4 (four) hours as needed for severe pain.   Yes Historical Provider, MD  phenazopyridine (PYRIDIUM) 100 MG tablet Take  100 mg by mouth 3 (three) times daily as needed for pain.   Yes Historical Provider, MD  tamsulosin (FLOMAX) 0.4 MG CAPS capsule Take 1 capsule (0.4 mg total) by mouth daily. 03/19/16  Yes Kathyrn Drown, MD  predniSONE (DELTASONE) 20 MG tablet Take 3 tablet by mouth for 3 days, then take 2 tablet by mouth for 3 days, then take 1 tablet by mouth for 2 days. 03/25/16   Mikey Kirschner, MD     Family History  Problem Relation Age of Onset  . Hypertension Father   . Cancer Father     lung  . Dementia Mother   . Diabetes Mother   . Hypertension Brother     Social History   Social History  . Marital status: Married    Spouse name: N/A  . Number of children: N/A  . Years of education: N/A   Social History Main Topics  . Smoking status: Never Smoker  . Smokeless tobacco: Never Used  . Alcohol use 0.0 oz/week  . Drug use: No  . Sexual activity: Yes   Other Topics Concern  . None   Social History Narrative  . None     Review of Systems he currently denies fever, headache, chest pain, dyspnea, cough, vomiting or abnormal bleeding. He has  had abdominal/back discomfort as well as intermittent nausea  Vital Signs: BP 133/81 (BP Location: Left Arm)   Pulse 73   Temp 97.9 F (36.6 C) (Oral)   Resp 18   SpO2 99%   Physical Exam awake, alert; complaining of left flank /lateral abdominal discomfort; chest clear to auscultation bilaterally. Heart with regular rate and rhythm. Abdomen soft, positive bowel sounds, left greater  than right abdominal tenderness to palpation; lower extremities with no edema.  Mallampati Score:     Imaging: Ct Abdomen Pelvis W Contrast  Result Date: 03/22/2016 CLINICAL DATA:  Right renal college and right lower quadrant abdominal pain for 1 week. EXAM: CT ABDOMEN AND PELVIS WITH CONTRAST TECHNIQUE: Multidetector CT imaging of the abdomen and pelvis was performed using the standard protocol following bolus administration of intravenous contrast.  CONTRAST:  110mL ISOVUE-300 IOPAMIDOL (ISOVUE-300) INJECTION 61% COMPARISON:  CT chest 05/27/2015 FINDINGS: Lower chest: The lung bases are clear of acute process. No pleural effusion or pulmonary lesions. The heart is normal in size. No pericardial effusion. The distal esophagus and aorta are unremarkable. Hepatobiliary: No focal hepatic lesions or intrahepatic biliary dilatation. The gallbladder is normal. No common bile duct dilatation. Pancreas: No mass, inflammation or ductal dilatation. Spleen: Normal size.  No focal lesions. Adrenals/Urinary Tract: The adrenal glands are normal. The right kidney demonstrates hydronephrosis. The left kidney demonstrates parapelvic cysts. The right ureter appears to be obstructed by a retroperitoneal process and the left ureter may also be partly obstructed. No renal mass is identified. No bladder mass. Stomach/Bowel: The stomach, duodenum, small bowel and colon are grossly normal. No inflammatory changes, mass lesions or obstructive findings. There is a low-lying transverse cecum. The terminal ileum and appendix are normal. There is a moderate amount of stool throughout the colon which may suggest constipation. Vascular/Lymphatic: There are ill-defined areas of mesenteric soft tissue density along with scattered mesenteric lymph nodes. A mesenteric "Mass" measures 23.5 x 35 mm on image number 43. A second mass measures 41 x 29 mm on image 49. There is also ill-defined soft tissue density in the retroperitoneum surrounding the IVC and aorta largest nodal area measures 4.0 x 2.5 cm on image number 53. This process continues down into the pelvis adjacent to the iliac vessels, right greater than left. Soft tissue mass on the right side measures a maximum of 4.5 cm on image number 67. The aorta is normal in caliber. The branch vessels are patent. The major venous structures are patent. These findings could be due to lymphoma. Reproductive: The prostate gland and seminal vesicles  are unremarkable. Other: No discrete pelvic mass. Ill-defined pelvic lymph nodes in soft tissue density. No inguinal mass or adenopathy. Musculoskeletal: No significant bony findings. IMPRESSION: 1. Ill-defined soft tissue masses and enlarged lymph nodes in the mesenteric and retroperitoneum. 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 I do not see an obvious GI tumor to account for this. It is unlikely retroperitoneal fibrosis given the mesenteric findings. PET-CT and tissue sampling may be helpful for staging and diagnosis. 2. Right-sided hydronephrosis due to involvement of the right ureter by the retroperitoneal process. There is also probable partial obstruction of the left ureter. Patient may need urologic consultation for stenting. Electronically Signed   By: Marijo Sanes M.D.   On: 03/22/2016 20:26   US Renal  Result Date: 04/02/2016 CLINICAL DATA:  Right flank pain. History of right hydronephrosis with known obstructing right pelvic adenopathy. EXAM: RENAL / URINARY TRACT ULTRASOUND COMPLETE COMPARISON:  04/01/2016 PET-CT. FINDINGS: Right Kidney: Length: 15.1 cm. Mildly echogenic right kidney. Mild to moderate right hydronephrosis. No right renal mass. Left Kidney: Length: 14.4 cm. Mildly echogenic left kidney. No left hydronephrosis. Cystic structures in the lower left renal pelvis represent parapelvic renal cysts as seen on 03/22/2016 delayed CT abdomen images. No left renal mass. Bladder: Appears normal for degree of bladder distention. No right ureteral jet. Normal left ureteral  jet. IMPRESSION: 1. Mild to moderate right hydronephrosis, stable since PET-CT from 1 day prior. Absence of right ureteral jet. Findings are consistent with right ureteral obstruction by known infiltrative right pelvic adenopathy. 2. No left hydronephrosis.  Left parapelvic renal cysts. 3. Mildly echogenic kidneys, indicating nonspecific renal parenchymal disease of uncertain chronicity . 4. Normal bladder. Electronically Signed   By:  Ilona Sorrel M.D.   On: 04/02/2016 14:37   Nm Pet Image Initial (pi) Skull Base To Thigh  Result Date: 04/01/2016 CLINICAL DATA:  Initial treatment strategy for mesenteric, retroperitoneal and bilateral pelvic lymphadenopathy seen on recent CT abdomen/pelvis study. EXAM: NUCLEAR MEDICINE PET SKULL BASE TO THIGH TECHNIQUE: 11.3 mCi F-18 FDG was injected intravenously. Full-ring PET imaging was performed from the skull base to thigh after the radiotracer. CT data was obtained and used for attenuation correction and anatomic localization. FASTING BLOOD GLUCOSE:  Value: 87 mg/dl COMPARISON:  03/22/2016 CT abdomen/pelvis.  05/27/2015 chest CT. FINDINGS: NECK No hypermetabolic lymph nodes in the neck. CHEST No hypermetabolic axillary, mediastinal or hilar nodes. No pleural effusions. No acute consolidative airspace disease, lung masses or significant pulmonary nodules. ABDOMEN/PELVIS Intensely hypermetabolic conglomerate left mesenteric node measuring 2.7 cm short axis with max SUV 33.9 (series 4/image 154). There is intensely hypermetabolic confluent bulky adenopathy in the lower aortocaval region extending into the bilateral common iliac region, measuring 3.4 cm short axis with max SUV 36.2 (series 4/image 155). There is intensely hypermetabolic infiltrative soft tissue throughout the bilateral extraperitoneal pelvis and less prominently in the bilateral retroperitoneum, with associated confluent adenopathy in the right common / external / internal iliac chains with max SUV 35.4 with largest short axis diameter 3.4 cm (series 4/image 163). There is associated stable mild right hydroureteronephrosis to the level of the confluent right common iliac adenopathy. Right deep pelvic sidewall infiltrative soft tissue measures up to the 1.6 cm thickness and demonstrates max SUV 26.6 (series 4/image 192). Right retrocaval region infiltrative soft tissue measures up to 0.7 cm thickness in demonstrates max SUV 13.9 (series 4/  image 125). Presacral space infiltrative soft tissue measures up to 1.0 cm thickness and demonstrates max SUV 15.6 (series 4/image 190). Left external iliac region infiltrative soft tissue measures up to 01.5 cm thickness and demonstrates max SUV 25.2 (series 4/image 188). No abnormal hypermetabolic activity within the liver, pancreas, adrenal glands, or spleen. Top-normal size prostate with nonspecific internal prostatic calcifications. No left hydronephrosis. Stable left parapelvic renal cysts. SKELETON No focal hypermetabolic activity to suggest skeletal metastasis. IMPRESSION: 1. Intensely hypermetabolic bulky left mesenteric, aortocaval, bilateral common iliac and right external/internal iliac adenopathy. Intensely hypermetabolic infiltrative soft tissue throughout the bilateral extraperitoneal pelvis and less prominently in the bilateral retroperitoneum. 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 2. No hypermetabolic disease in the neck, chest or skeleton. 3. Stable mild right hydroureteronephrosis to the level of the bulky right common iliac adenopathy. Electronically Signed   By: Ilona Sorrel M.D.   On: 04/01/2016 15:14    Labs:  CBC:  Recent Labs  04/22/15 0924 03/22/16 1740 04/02/16 1236 04/07/16 0740  WBC 5.6 6.1 11.7* 9.9  HGB  --  15.1 15.3 15.4  HCT 46.0 44.6 43.6 43.7  PLT 285 364 325 302    COAGS:  Recent Labs  04/07/16 0740  INR 0.92    BMP:  Recent Labs  04/22/15 0924 03/22/16 1740 04/02/16 1236 04/07/16 0740  NA 139 138 134* 139  K 4.3 4.1 4.1 4.2  CL 100 102 100*  106  CO2 24 27 26 26   GLUCOSE 99 87 107* 121*  BUN 16 20 20 19   CALCIUM 9.6 9.4 9.1 9.6  CREATININE 1.08 1.12 1.42* 0.96  GFRNONAA 76 >60 53* >60  GFRAA 88 >60 >60 >60    LIVER FUNCTION TESTS:  Recent Labs  04/22/15 0924 03/22/16 1740 04/07/16 0740  BILITOT 0.6 0.4 0.8  AST 26 21 17   ALT 33 22 20  ALKPHOS 72 70 69  PROT 6.8 7.4 7.0  ALBUMIN 4.6 4.6 4.0    TUMOR  MARKERS: No results for input(s): AFPTM, CEA, CA199, CHROMGRNA in the last 8760 hours.  Assessment and Plan: 60 y.o. male with prior history of melanoma and 2- 3 week history of flank/back pain and subsequent imaging which has revealed hypermetabolic ill-defined soft tissue masses and enlarged lymph nodes in the mesenteric and retroperitoneum worrisome for lymphoma. There was also right-sided hydronephrosis due to involvement of the right ureter by the retroperitoneal  process/partial obstruction of the left ureter. He is status post right ureteral stent placement on 04/02/16 by Dr. McDiarmid. He presents today for image guided biopsy of the pelvic/retroperitoneal lymphadenopathy for further evaluation.Risks and benefits discussed with the patient/family including, but not limited to bleeding, infection, damage to adjacent structures or low yield requiring additional tests.All of the patient's questions were answered, patient is agreeable to proceed.Consent signed and in chart.     Thank you for this interesting consult.  I greatly enjoyed meeting John Marquez and look forward to participating in their care.  A copy of this report was sent to the requesting provider on this date.  Electronically Signed: D. Rowe Robert 04/07/2016, 8:47 AM   I spent a total of 30 minutes in face to face in clinical consultation, greater than 50% of which was counseling/coordinating care for image guided biopsy of retroperitoneal/pelvic lymphadenopathy

## 2016-04-09 ENCOUNTER — Other Ambulatory Visit (HOSPITAL_COMMUNITY): Payer: Self-pay | Admitting: Hematology & Oncology

## 2016-04-09 ENCOUNTER — Telehealth (HOSPITAL_COMMUNITY): Payer: Self-pay | Admitting: *Deleted

## 2016-04-09 MED ORDER — OXYCODONE HCL ER 15 MG PO T12A: 15.0000 mg | EXTENDED_RELEASE_TABLET | Freq: Two times a day (BID) | ORAL | 0 refills | Status: DC

## 2016-04-09 MED ORDER — OXYCODONE-ACETAMINOPHEN 5-325 MG PO TABS: 1.0000 | ORAL_TABLET | ORAL | 0 refills | Status: DC | PRN

## 2016-04-12 ENCOUNTER — Other Ambulatory Visit (HOSPITAL_COMMUNITY): Payer: Self-pay | Admitting: Hematology & Oncology

## 2016-04-12 DIAGNOSIS — C8513 Unspecified B-cell lymphoma, intra-abdominal lymph nodes: Secondary | ICD-10-CM

## 2016-04-13 ENCOUNTER — Other Ambulatory Visit: Payer: Self-pay | Admitting: General Surgery

## 2016-04-13 ENCOUNTER — Other Ambulatory Visit: Payer: Self-pay | Admitting: Radiology

## 2016-04-14 ENCOUNTER — Ambulatory Visit (HOSPITAL_COMMUNITY)
Admission: RE | Admit: 2016-04-14 | Discharge: 2016-04-14 | Disposition: A | Discharge status: Home / Self Care | Payer: 59 | Source: Ambulatory Visit | Attending: Hematology & Oncology | Admitting: Hematology & Oncology

## 2016-04-14 ENCOUNTER — Encounter (HOSPITAL_COMMUNITY): Payer: Self-pay

## 2016-04-14 DIAGNOSIS — C8513 Unspecified B-cell lymphoma, intra-abdominal lymph nodes: Secondary | ICD-10-CM

## 2016-04-14 DIAGNOSIS — Z85820 Personal history of malignant melanoma of skin: Secondary | ICD-10-CM | POA: Diagnosis not present

## 2016-04-14 DIAGNOSIS — L309 Dermatitis, unspecified: Secondary | ICD-10-CM | POA: Insufficient documentation

## 2016-04-14 DIAGNOSIS — C8590 Non-Hodgkin lymphoma, unspecified, unspecified site: Secondary | ICD-10-CM | POA: Insufficient documentation

## 2016-04-14 DIAGNOSIS — J309 Allergic rhinitis, unspecified: Secondary | ICD-10-CM | POA: Diagnosis not present

## 2016-04-14 LAB — CBC
HCT: 42.5 % (ref 39.0–52.0)
HEMOGLOBIN: 14.3 g/dL (ref 13.0–17.0)
MCH: 29.5 pg (ref 26.0–34.0)
MCHC: 33.6 g/dL (ref 30.0–36.0)
MCV: 87.8 fL (ref 78.0–100.0)
PLATELETS: 364 10*3/uL (ref 150–400)
RBC: 4.84 MIL/uL (ref 4.22–5.81)
RDW: 12.9 % (ref 11.5–15.5)
WBC: 7.1 10*3/uL (ref 4.0–10.5)

## 2016-04-14 LAB — PROTIME-INR
INR: 0.88
PROTHROMBIN TIME: 12 s (ref 11.4–15.2)

## 2016-04-14 LAB — BONE MARROW EXAM

## 2016-04-14 LAB — APTT: aPTT: 32 seconds (ref 24–36)

## 2016-04-14 MED ORDER — MIDAZOLAM HCL 2 MG/2ML IJ SOLN
INTRAMUSCULAR | Status: AC
  Filled 2016-04-14: qty 6

## 2016-04-14 MED ORDER — SODIUM CHLORIDE 0.9 % IV SOLN
INTRAVENOUS | Status: DC
  Administered 2016-04-14: 08:00:00 via INTRAVENOUS

## 2016-04-14 MED ORDER — MIDAZOLAM HCL 2 MG/2ML IJ SOLN
INTRAMUSCULAR | Status: AC | PRN
  Administered 2016-04-14: 1 mg via INTRAVENOUS

## 2016-04-14 MED ORDER — MIDAZOLAM HCL 2 MG/2ML IJ SOLN
INTRAMUSCULAR | Status: DC | PRN
  Administered 2016-04-14: 1 mg via INTRAVENOUS

## 2016-04-14 MED ORDER — FENTANYL CITRATE (PF) 100 MCG/2ML IJ SOLN
INTRAMUSCULAR | Status: AC
  Filled 2016-04-14: qty 4

## 2016-04-14 MED ORDER — FENTANYL CITRATE (PF) 100 MCG/2ML IJ SOLN
INTRAMUSCULAR | Status: AC | PRN
  Administered 2016-04-14: 50 ug via INTRAVENOUS

## 2016-04-14 NOTE — Procedures (Signed)
Interventional Radiology Procedure Note  Procedure: CT guided aspirate and core biopsy of right posterior iliac bone Complications: None Recommendations: - Bedrest supine x 1 hrs - OTC's PRN  Pain - Follow biopsy results  Signed,  Ayse Mccartin S. Ltanya Bayley, DO    

## 2016-04-14 NOTE — Discharge Instructions (Signed)
Bone Marrow Aspiration and Bone Marrow Biopsy Bone marrow aspiration and bone marrow biopsy are procedures that are done to diagnose blood disorders. You may also have one of these procedures to help diagnose infections or some types of cancer. Bone marrow is the soft tissue that is inside your bones. Blood cells are produced in bone marrow. For bone marrow aspiration, a sample of tissue in liquid form is removed from inside your bone. For a bone marrow biopsy, a small core of bone marrow tissue is removed. Then these samples are examined under a microscope or tested in a lab. You may need these procedures if you have an abnormal complete blood count (CBC). The aspiration or biopsy sample is usually taken from the top of your hip bone. Sometimes, an aspiration sample is taken from your chest bone (sternum). LET [facility] CARE PROVIDER KNOW ABOUT:  Any allergies you have.  All medicines you are taking, including vitamins, herbs, eye drops, creams, and over-the-counter medicines.  Previous problems you or members of your family have had with the use of anesthetics.  Any blood disorders you have.  Previous surgeries you have had.  Any medical conditions you may have.  Whether you are pregnant or you think that you may be pregnant. RISKS AND COMPLICATIONS Generally, this is a safe procedure. However, problems may occur, including:  Infection.  Bleeding. BEFORE THE PROCEDURE  Ask your health care provider about:  Changing or stopping your regular medicines. This is especially important if you are taking diabetes medicines or blood thinners.  Taking medicines such as aspirin and ibuprofen. These medicines can thin your blood. Do not take these medicines before your procedure if your health care provider instructs you not to.  Plan to have someone take you home after the procedure.  If you go home right after the procedure, plan to have someone with you for 24 hours. PROCEDURE   An  IV tube may be inserted into one of your veins.  The injection site will be cleaned with a germ-killing solution (antiseptic).  You will be given one or more of the following:  A medicine that helps you relax (sedative).  A medicine that numbs the area (local anesthetic).  The bone marrow sample will be removed as follows:  For an aspiration, a hollow needle will be inserted through your skin and into your bone. Bone marrow fluid will be drawn up into a syringe.  For a biopsy, your health care provider will use a hollow needle to remove a core of tissue from your bone marrow.  The needle will be removed.  A bandage (dressing) will be placed over the insertion site and taped in place. The procedure may vary among health care providers and hospitals. AFTER THE PROCEDURE  Your blood pressure, heart rate, breathing rate, and blood oxygen level will be monitored often until the medicines you were given have worn off.  Return to your normal activities as directed by your health care provider.   This information is not intended to replace advice given to you by your health care provider. Make sure you discuss any questions you have with your health care provider.   Document Released: 06/24/2004 Document Revised: 11/05/2014 Document Reviewed: 06/12/2014 Elsevier Interactive Patient Education 2016 [place].  Bone Marrow Aspiration and Bone Marrow Biopsy, Care After Refer to this sheet in the next few weeks. These instructions provide you with information about caring for yourself after your procedure. Your health care provider may also give  you more specific instructions. Your treatment has been planned according to current medical practices, but problems sometimes occur. Call your health care provider if you have any problems or questions after your procedure. °WHAT TO EXPECT AFTER THE PROCEDURE °After your procedure, it is common to have: °· Soreness or tenderness around the puncture  site. °· Bruising. °HOME CARE INSTRUCTIONS °· Take medicines only as directed by your health care provider. °· Follow your health care provider's instructions about: °¨ Puncture site care. °¨ Bandage (dressing) changes and removal. °· Bathe and shower as directed by your health care provider. °· Check your puncture site every day for signs of infection. Watch for: °¨ Redness, swelling, or pain. °¨ Fluid, blood, or pus. °· Return to your normal activities as directed by your health care provider. °· Keep all follow-up visits as directed by your health care provider. This is important. °SEEK MEDICAL CARE IF: °· You have a fever. °· You have uncontrollable bleeding. °· You have redness, swelling, or pain at the site of your puncture. °· You have fluid, blood, or pus coming from your puncture site. °  °This information is not intended to replace advice given to you by your health care provider. Make sure you discuss any questions you have with your health care provider. °  °Document Released: 01/08/2005 Document Revised: 11/05/2014 Document Reviewed: 06/12/2014 °Elsevier Interactive Patient Education ©2016 [employer] ° °Moderate Conscious Sedation, Adult, Care After °Refer to this sheet in the next few weeks. These instructions provide you with information on caring for yourself after your procedure. Your health care provider may also give you more specific instructions. Your treatment has been planned according to current medical practices, but problems sometimes occur. Call your health care provider if you have any problems or questions after your procedure. °WHAT TO EXPECT AFTER THE PROCEDURE  °After your procedure: °· You may feel sleepy, clumsy, and have poor balance for several hours. °· Vomiting may occur if you eat too soon after the procedure. °HOME CARE INSTRUCTIONS °· Do not participate in any activities where you could become injured for at least 24 hours. Do not: °¨ Drive. °¨ Swim. °¨ Ride a  bicycle. °¨ Operate heavy machinery. °¨ Cook. °¨ Use power tools. °¨ Climb ladders. °¨ Work from a high place. °· Do not make important decisions or sign legal documents until you are improved. °· If you vomit, drink water, juice, or soup when you can drink without vomiting. Make sure you have little or no nausea before eating solid foods. °· Only take over-the-counter or prescription medicines for pain, discomfort, or fever as directed by your health care provider. °· Make sure you and your family fully understand everything about the medicines given to you, including what side effects may occur. °· You should not drink alcohol, take sleeping pills, or take medicines that cause drowsiness for at least 24 hours. °· If you smoke, do not smoke without supervision. °· If you are feeling better, you may resume normal activities 24 hours after you were sedated. °· Keep all appointments with your health care provider. °SEEK MEDICAL CARE IF: °· Your skin is pale or bluish in color. °· You continue to feel nauseous or vomit. °· Your pain is getting worse and is not helped by medicine. °· You have bleeding or swelling. °· You are still sleepy or feeling clumsy after 24 hours. °SEEK IMMEDIATE MEDICAL CARE IF: °· You develop a rash. °· You have difficulty breathing. °· You develop any   type of allergic problem.  You have a fever. MAKE SURE YOU:  Understand these instructions.  Will watch your condition.  Will get help right away if you are not doing well or get worse.   This information is not intended to replace advice given to you by your health care provider. Make sure you discuss any questions you have with your health care provider.   Document Released: 04/11/2013 Document Revised: 07/12/2014 Document Reviewed: 04/11/2013 Elsevier Interactive Patient Education [employer].

## 2016-04-14 NOTE — Progress Notes (Signed)
Referring Physician(s): Patrici Ranks  Supervising Physician: Corrie Mckusick  Patient Status:  Outpatient  Chief Complaint:  lymphoma  Subjective:  Pt familiar to IR service from prior mesenteric mass biopsy on 04/07/16. He has been recently diagnosed with NHL and presents again today for CT-guided bone marrow biopsy for further staging. He currently denies fever, headache, chest pain, dyspnea, cough, nausea, vomiting or abnormal bleeding. He does have fatigue, and intermittent left-sided abdominal/flank pain.  Past Medical History:  Diagnosis Date  . Allergic rhinitis   . Eczema    Winter  . History of melanoma excision    07-23-2015 and 07-31-2015  mid upper back  . Hydronephrosis, right   . Retroperitoneal lymphadenopathy    Past Surgical History:  Procedure Laterality Date  . COLONOSCOPY  2010  . CYSTOSCOPY WITH STENT PLACEMENT Right 04/02/2016   Procedure: CYSTOSCOPY WITH RIGHT RETROGRADE PYELOGRAM AND STENT PLACEMENT;  Surgeon: Bjorn Loser, MD;  Location: [facility];  Service: Urology;  Laterality: Right;     Allergies: Penicillins and Levaquin [levofloxacin]  Medications: Prior to Admission medications   Medication Sig Start Date End Date Taking? Authorizing Provider  ibuprofen (ADVIL,MOTRIN) 200 MG tablet Take 200-800 mg by mouth every 6 (six) hours as needed for moderate pain.   Yes Historical Provider, MD  oxyCODONE-acetaminophen (PERCOCET/ROXICET) 5-325 MG tablet Take 1-2 tablets by mouth every 4 (four) hours as needed for severe pain. 04/09/16  Yes Patrici Ranks, MD  tamsulosin (FLOMAX) 0.4 MG CAPS capsule Take 1 capsule (0.4 mg total) by mouth daily. 03/19/16  Yes Kathyrn Drown, MD  oxyCODONE (OXYCONTIN) 15 mg 12 hr tablet Take 1 tablet (15 mg total) by mouth every 12 (twelve) hours. 04/09/16   Patrici Ranks, MD  phenazopyridine (PYRIDIUM) 100 MG tablet Take 100 mg by mouth 3 (three) times daily as needed for pain.    Historical Provider, MD    predniSONE (DELTASONE) 20 MG tablet Take 3 tablet by mouth for 3 days, then take 2 tablet by mouth for 3 days, then take 1 tablet by mouth for 2 days. 03/25/16   Mikey Kirschner, MD     Vital Signs: BP 122/81 (BP Location: Left Arm)   Pulse 70   Temp 97.6 F (36.4 C) (Oral)   Resp 18   SpO2 95%   Physical Exam patient awake, alert; chest with few bibasilar crackles. Heart with regular rate and rhythm. Abdomen soft, positive bowel sounds, mild left-sided tenderness to palpation; extremities with no edema  Imaging: No results found.  Labs:  CBC:  Recent Labs  03/22/16 1740 04/02/16 1236 04/07/16 0740 04/14/16 0712  WBC 6.1 11.7* 9.9 7.1  HGB 15.1 15.3 15.4 14.3  HCT 44.6 43.6 43.7 42.5  PLT 364 325 302 364    COAGS:  Recent Labs  04/07/16 0740 04/14/16 0712  INR 0.92 0.88  APTT  --  32    BMP:  Recent Labs  04/22/15 0924 03/22/16 1740 04/02/16 1236 04/07/16 0740  NA 139 138 134* 139  K 4.3 4.1 4.1 4.2  CL 100 102 100* 106  CO2 24 27 26 26   GLUCOSE 99 87 107* 121*  BUN 16 20 20 19   CALCIUM 9.6 9.4 9.1 9.6  CREATININE 1.08 1.12 1.42* 0.96  GFRNONAA 76 >60 53* >60  GFRAA 88 >60 >60 >60    LIVER FUNCTION TESTS:  Recent Labs  04/22/15 0924 03/22/16 1740 04/07/16 0740  BILITOT 0.6 0.4 0.8  AST 26 21 17  ALT 33 22 20  ALKPHOS 72 70 69  PROT 6.8 7.4 7.0  ALBUMIN 4.6 4.6 4.0    Assessment and Plan: Patient with recently diagnosed non-Hodgkin's lymphoma. Plan today is for CT-guided bone marrow biopsy for further staging.Risks and benefits discussed with the patient/wife including, but not limited to bleeding, infection, damage to adjacent structures or low yield requiring additional tests.All of the patient's questions were answered, patient is agreeable to proceed.Consent signed and in chart.     Electronically Signed: D. Rowe Robert 04/14/2016, 8:34 AM   I spent a total of 20 minutes at the the patient's bedside AND on the patient's  hospital floor or unit, greater than 50% of which was counseling/coordinating care for CT guided bone marrow biopsy    Patient ID: John Marquez, male   DOB: [birth year], 58 y.o.   MRN: [id number]

## 2016-04-15 ENCOUNTER — Telehealth (HOSPITAL_COMMUNITY): Payer: Self-pay | Admitting: Hematology & Oncology

## 2016-04-15 NOTE — Telephone Encounter (Signed)
Received final results from pathology today. 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 Patient will be referred to [facility] for additional treatment.  Kirby Crigler PA, spoke with Carma Leaven MD (Fellow) at [facility]. Dr. Lonia Blood has agreed to admit patient on Monday to [facility] inpatient service.    I called patient and his wife and discussed diagnosis and recommendations with them. They are agreeable with plan. They will stop by tomorrow to pick up notes, BMBX and path reports.  Patient currently notes pain is fairly well controlled.  [facility] is to call us tomorrow and patient.  Donald Pore MD

## 2016-04-16 LAB — TISSUE HYBRIDIZATION TO NCBH

## 2016-04-19 ENCOUNTER — Ambulatory Visit (HOSPITAL_COMMUNITY): Payer: 59

## 2016-04-22 ENCOUNTER — Encounter (HOSPITAL_COMMUNITY): Payer: Self-pay

## 2016-04-26 ENCOUNTER — Encounter (HOSPITAL_COMMUNITY): Payer: Self-pay | Admitting: Oncology

## 2016-04-26 ENCOUNTER — Other Ambulatory Visit (HOSPITAL_COMMUNITY): Payer: Self-pay | Admitting: Oncology

## 2016-04-26 DIAGNOSIS — C8333 Diffuse large B-cell lymphoma, intra-abdominal lymph nodes: Secondary | ICD-10-CM

## 2016-04-27 LAB — CHROMOSOME ANALYSIS, BONE MARROW

## 2016-04-29 ENCOUNTER — Encounter (HOSPITAL_COMMUNITY): Payer: Self-pay

## 2016-04-30 ENCOUNTER — Telehealth (HOSPITAL_COMMUNITY): Payer: Self-pay | Admitting: *Deleted

## 2016-04-30 ENCOUNTER — Other Ambulatory Visit (HOSPITAL_COMMUNITY): Payer: Self-pay | Admitting: *Deleted

## 2016-04-30 ENCOUNTER — Encounter (HOSPITAL_COMMUNITY): Payer: Self-pay | Admitting: Hematology & Oncology

## 2016-04-30 ENCOUNTER — Encounter (HOSPITAL_COMMUNITY): Payer: 59 | Attending: Hematology & Oncology | Admitting: Hematology & Oncology

## 2016-04-30 ENCOUNTER — Encounter (HOSPITAL_COMMUNITY): Payer: 59

## 2016-04-30 VITALS — BP 124/85 | HR 101 | Temp 97.9°F | Resp 16 | Wt 215.2 lb

## 2016-04-30 DIAGNOSIS — C8330 Diffuse large B-cell lymphoma, unspecified site: Secondary | ICD-10-CM

## 2016-04-30 DIAGNOSIS — C8333 Diffuse large B-cell lymphoma, intra-abdominal lymph nodes: Secondary | ICD-10-CM | POA: Insufficient documentation

## 2016-04-30 DIAGNOSIS — M549 Dorsalgia, unspecified: Secondary | ICD-10-CM | POA: Diagnosis not present

## 2016-04-30 DIAGNOSIS — R319 Hematuria, unspecified: Secondary | ICD-10-CM | POA: Insufficient documentation

## 2016-04-30 DIAGNOSIS — N390 Urinary tract infection, site not specified: Secondary | ICD-10-CM | POA: Diagnosis not present

## 2016-04-30 DIAGNOSIS — N135 Crossing vessel and stricture of ureter without hydronephrosis: Secondary | ICD-10-CM

## 2016-04-30 DIAGNOSIS — F418 Other specified anxiety disorders: Secondary | ICD-10-CM

## 2016-04-30 DIAGNOSIS — R109 Unspecified abdominal pain: Secondary | ICD-10-CM

## 2016-04-30 DIAGNOSIS — F411 Generalized anxiety disorder: Secondary | ICD-10-CM

## 2016-04-30 LAB — CBC WITH DIFFERENTIAL/PLATELET
BASOS ABS: 0 10*3/uL (ref 0.0–0.1)
BASOS PCT: 0 %
EOS PCT: 7 %
Eosinophils Absolute: 0.7 10*3/uL (ref 0.0–0.7)
HCT: 39.2 % (ref 39.0–52.0)
Hemoglobin: 13.1 g/dL (ref 13.0–17.0)
Lymphocytes Relative: 9 %
Lymphs Abs: 0.8 10*3/uL (ref 0.7–4.0)
MCH: 28.8 pg (ref 26.0–34.0)
MCHC: 33.4 g/dL (ref 30.0–36.0)
MCV: 86.2 fL (ref 78.0–100.0)
MONO ABS: 0.2 10*3/uL (ref 0.1–1.0)
Monocytes Relative: 2 %
Neutro Abs: 7.3 10*3/uL (ref 1.7–7.7)
Neutrophils Relative %: 82 %
PLATELETS: 234 10*3/uL (ref 150–400)
RBC: 4.55 MIL/uL (ref 4.22–5.81)
RDW: 12.6 % (ref 11.5–15.5)
WBC: 8.9 10*3/uL (ref 4.0–10.5)

## 2016-04-30 LAB — URINE MICROSCOPIC-ADD ON

## 2016-04-30 LAB — COMPREHENSIVE METABOLIC PANEL
ALBUMIN: 3.5 g/dL (ref 3.5–5.0)
ALT: 27 U/L (ref 17–63)
AST: 15 U/L (ref 15–41)
Alkaline Phosphatase: 81 U/L (ref 38–126)
Anion gap: 5 (ref 5–15)
BUN: 18 mg/dL (ref 6–20)
CHLORIDE: 103 mmol/L (ref 101–111)
CO2: 27 mmol/L (ref 22–32)
Calcium: 8.8 mg/dL — ABNORMAL LOW (ref 8.9–10.3)
Creatinine, Ser: 0.95 mg/dL (ref 0.61–1.24)
GFR calc Af Amer: >60 mL/min (ref >60)
GFR calc non Af Amer: >60 mL/min (ref >60)
GLUCOSE: 109 mg/dL — AB (ref 65–99)
POTASSIUM: 3.8 mmol/L (ref 3.5–5.1)
SODIUM: 135 mmol/L (ref 135–145)
Total Bilirubin: 0.5 mg/dL (ref 0.3–1.2)
Total Protein: 6.1 g/dL — ABNORMAL LOW (ref 6.5–8.1)

## 2016-04-30 LAB — URINALYSIS, ROUTINE W REFLEX MICROSCOPIC
Bilirubin Urine: NEGATIVE
GLUCOSE, UA: NEGATIVE mg/dL
Ketones, ur: NEGATIVE mg/dL
Nitrite: NEGATIVE
PH: 6 (ref 5.0–8.0)
Protein, ur: NEGATIVE mg/dL
SPECIFIC GRAVITY, URINE: 1.01 (ref 1.005–1.030)

## 2016-04-30 MED ORDER — CIPROFLOXACIN HCL 500 MG PO TABS: 500.0000 mg | ORAL_TABLET | Freq: Two times a day (BID) | ORAL | 0 refills | Status: AC

## 2016-04-30 NOTE — Patient Instructions (Addendum)
John Marquez at [facility] Discharge Instructions  RECOMMENDATIONS MADE BY THE CONSULTANT AND ANY TEST RESULTS WILL BE SENT TO YOUR REFERRING PHYSICIAN.  You saw Dr.Penland today. Antibiotic (cipro) sent to [facility]. Follow up in 4 weeks. See Amy at checkout for appointments.  Thank you for choosing [place] at [facility] to provide your oncology and hematology care.  To afford each patient quality time with our provider, please arrive at least 15 minutes before your scheduled appointment time.   Beginning January 23rd 2017 lab work for the [facility] will be done in the  Main lab at [facility] on 1st floor. If you have a lab appointment with the [facility] please come in thru the  Main Entrance and check in at the main information desk  You need to re-schedule your appointment should you arrive 10 or more minutes late.  We strive to give you quality time with our providers, and arriving late affects you and other patients whose appointments are after yours.  Also, if you no show three or more times for appointments you may be dismissed from the clinic at the providers discretion.     Again, thank you for choosing [facility].  Our hope is that these requests will decrease the amount of time that you wait before being seen by our physicians.       _____________________________________________________________  Should you have questions after your visit to [facility], please contact our office at [phone] between the hours of 8:30 a.m. and 4:30 p.m.  Voicemails left after 4:30 p.m. will not be returned until the following business day.  For prescription refill requests, have your pharmacy contact our office.         Resources For Cancer Patients and their Caregivers ? American Cancer Society: Can assist with transportation, wigs, general needs, runs Look Good Feel Better.        234-067-0844 ? Cancer  Care: Provides financial assistance, online support groups, medication/co-pay assistance.  1-800-813-HOPE 831-011-7106) ? [place] Assists [place] cancer patients and their families through emotional , educational and financial support.  (213)042-9604 ? [place] DSS Where to apply for food stamps, Medicaid and utility assistance. 978-044-8395 ? [facility]: Transportation to medical appointments. (306) 516-8579 ? Social Security Administration: May apply for disability if have a Stage IV cancer. 331-840-7540 726-159-6716 ? [employer], Disability and Transit Services: Assists with nutrition, care and transit needs. [place] Support Programs: @10RELATIVEDAYS @ > Cancer Support Group  2nd Tuesday of the month 1pm-2pm, Journey Room  > Creative Journey  3rd Tuesday of the month 1130am-1pm, Journey Room  > Look Good Feel Better  1st Wednesday of the month 10am-12 noon, Journey Room (Call [place] to register [phone])

## 2016-04-30 NOTE — Progress Notes (Signed)
[place]  Progress Note  Patient Care Team: Kathyrn Drown, MD as PCP - General (Family Medicine)  CHIEF COMPLAINTS/PURPOSE OF CONSULTATION:  TH-DLBCL myc/BCL2/BCL6 translocation Ureteral Stent  HISTORY OF PRESENTING ILLNESS:  John Marquez 60 y.o. male is here because of TH-DLBCL. He completed his first cycle of R-EPOCH. He notes that he is going to do the remainder of therapy as outpatient.   John Marquez returns to the [facility] today accompanied by his wife. I personally reviewed and went over laboratory studies with the patient.  He reports hematuria today and the day before yesterday. He provided a urine sample today. He was taking a "purple pill" for the burning and thought it might have something to do with it.   When he uses the bathroom, "It feels like everything is just dropping out of your body". He describes bowel urgency, then after having a bowel movement his back will hurt. States his bowels really haven't been that loose.  He hasn't had a lot of pain except in his lower back. "Walking and stuff seems to help that out". His pain is a lot better. The lower back pain affects both sides.  The ureteral stent is in his right side.   He is eating pretty good. He was exercising right much when he was at [facility], "I've been doing fine with that".   The first day or so of treatment he had a sore up by his tooth but it went away.   He hasn't been feeling nauseated. He reports fatigue.  His wife asks about how patients deal with coping and the emotional stress of their diagnosis. He admits this is a very emotional situation.  He has received a flu shot this year.    MEDICAL HISTORY:  Past Medical History:  Diagnosis Date  . Allergic rhinitis   . 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 Eczema    Winter  . History of melanoma excision    07-23-2015 and 07-31-2015  mid upper  back  . Hydronephrosis, right   . Retroperitoneal lymphadenopathy     SURGICAL HISTORY: Past Surgical History:  Procedure Laterality Date  . COLONOSCOPY  2010  . CYSTOSCOPY WITH STENT PLACEMENT Right 04/02/2016   Procedure: CYSTOSCOPY WITH RIGHT RETROGRADE PYELOGRAM AND STENT PLACEMENT;  Surgeon: Bjorn Loser, MD;  Location: [facility];  Service: Urology;  Laterality: Right;    SOCIAL HISTORY: Social History   Social History  . Marital status: Married    Spouse name: N/A  . Number of children: N/A  . Years of education: N/A   Occupational History  . Not on file.   Social History Main Topics  . Smoking status: Never Smoker  . Smokeless tobacco: Never Used  . Alcohol use 0.0 oz/week  . Drug use: No  . Sexual activity: Yes   Other Topics Concern  . Not on file   Social History Narrative  . No narrative on file  Works for [employer]. Enjoys Animator, Biomedical scientist. NEVER smoker, occasional Beer. No Pets Married for 34 years 2 daughters, both married, no grandchildren  FAMILY HISTORY: Family History  Problem Relation Age of Onset  . Hypertension Father   . Cancer Father     lung  . Dementia Mother   . Diabetes Mother   . Hypertension Brother   Brother alive at 54 with hypertension Father deceased at 75  from Lung cancer he was a smoker Mother alive at 56, healthy, mild dementia, diabetes  ALLERGIES:  is allergic to penicillins and levaquin [levofloxacin].  MEDICATIONS:  Current Outpatient Prescriptions  Medication Sig Dispense Refill  . ibuprofen (ADVIL,MOTRIN) 200 MG tablet Take 200-800 mg by mouth every 6 (six) hours as needed for moderate pain.    Marland Kitchen oxyCODONE (OXYCONTIN) 15 mg 12 hr tablet Take 1 tablet (15 mg total) by mouth every 12 (twelve) hours. 60 tablet 0  . oxyCODONE-acetaminophen (PERCOCET/ROXICET) 5-325 MG tablet Take 1-2 tablets by mouth every 4 (four) hours as needed for severe pain. 90 tablet 0  . phenazopyridine (PYRIDIUM) 100 MG tablet Take  100 mg by mouth 3 (three) times daily as needed for pain.    . predniSONE (DELTASONE) 20 MG tablet Take 3 tablet by mouth for 3 days, then take 2 tablet by mouth for 3 days, then take 1 tablet by mouth for 2 days. 17 tablet 0  . tamsulosin (FLOMAX) 0.4 MG CAPS capsule Take 1 capsule (0.4 mg total) by mouth daily. 20 capsule 1   No current facility-administered medications for this visit.     Review of Systems  Constitutional: Positive for malaise/fatigue. Negative for chills, fever and weight loss.  HENT: Negative for congestion, hearing loss, nosebleeds, sore throat and tinnitus.   Eyes: Negative for blurred vision, double vision, pain and discharge.  Respiratory: Negative for cough, hemoptysis, sputum production, shortness of breath and wheezing.   Cardiovascular: Negative for chest pain, palpitations, claudication, leg swelling and PND.  Gastrointestinal: Negative for blood in stool, constipation, diarrhea, heartburn, melena, nausea and vomiting.  Genitourinary: Positive for hematuria. Negative for dysuria, frequency and urgency.  Musculoskeletal: Positive for back pain (improved). Negative for falls, joint pain and myalgias.  Skin: Negative for itching and rash.  Neurological: Negative for dizziness, tingling, tremors, sensory change, speech change, focal weakness, seizures, loss of consciousness, weakness and headaches.  Endo/Heme/Allergies: Does not bruise/bleed easily.  Psychiatric/Behavioral: Positive for depression. Negative for memory loss, substance abuse and suicidal ideas. The patient is not nervous/anxious and does not have insomnia.        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  HENT:  Head: Normocephalic and atraumatic.  Nose: Nose normal.  Mouth/Throat: Oropharynx is clear and moist. No oropharyngeal exudate.  Eyes: Conjunctivae and EOM are normal. Pupils are equal, round, and reactive to light. Right eye exhibits no discharge. Left eye exhibits no discharge. No scleral icterus.  Neck: Normal range of motion. Neck supple. No tracheal deviation present. No thyromegaly present.  Cardiovascular: Normal rate, regular rhythm and normal heart sounds.  Exam reveals no gallop and no friction rub.   No murmur heard. Pulmonary/Chest: Effort normal and breath sounds normal. He has no wheezes. He has no rales.  Abdominal: Soft. Bowel sounds are normal. He exhibits no distension and no mass. There is no tenderness. There is no rebound and no guarding.  Musculoskeletal: Normal range of motion. He exhibits no edema.  Lymphadenopathy:    He has no cervical  adenopathy.  Neurological: He is alert and oriented to person, place, and time. He has normal reflexes. No cranial nerve deficit. Gait normal. Coordination normal.  Skin: Skin is warm and dry. No rash noted.  Psychiatric: Mood, memory, affect and judgment normal.  Nursing note and vitals reviewed.   LABORATORY DATA:  I have reviewed the data as listed Results for KOHEI, ANTONELLIS (MRN [id number]) as of 05/29/2016 14:22  Ref. Range 04/30/2016 13:19  Sodium Latest Ref Range: 135 - 145 mmol/L 135  Potassium Latest Ref Range: 3.5 - 5.1 mmol/L 3.8  Chloride Latest Ref Range: 101 - 111 mmol/L 103  CO2 Latest Ref Range: 22 - 32 mmol/L 27  BUN Latest Ref Range: 6 - 20 mg/dL 18  Creatinine Latest Ref Range: 0.61 - 1.24 mg/dL 0.95  Calcium Latest Ref Range: 8.9 - 10.3 mg/dL 8.8 (L)  EGFR (Non-African Amer.) Latest Ref Range: >60 mL/min >60  EGFR (African American) Latest Ref Range: >60 mL/min >60  Glucose Latest Ref  Range: 65 - 99 mg/dL 109 (H)  Anion gap Latest Ref Range: 5 - 15  5  Alkaline Phosphatase Latest Ref Range: 38 - 126 U/L 81  Albumin Latest Ref Range: 3.5 - 5.0 g/dL 3.5  AST Latest Ref Range: 15 - 41 U/L 15  ALT Latest Ref Range: 17 - 63 U/L 27  Total Protein Latest Ref Range: 6.5 - 8.1 g/dL 6.1 (L)  Total Bilirubin Latest Ref Range: 0.3 - 1.2 mg/dL 0.5  WBC Latest Ref Range: 4.0 - 10.5 K/uL 8.9  RBC Latest Ref Range: 4.22 - 5.81 MIL/uL 4.55  Hemoglobin Latest Ref Range: 13.0 - 17.0 g/dL 13.1  HCT Latest Ref Range: 39.0 - 52.0 % 39.2  MCV Latest Ref Range: 78.0 - 100.0 fL 86.2  MCH Latest Ref Range: 26.0 - 34.0 pg 28.8  MCHC Latest Ref Range: 30.0 - 36.0 g/dL 33.4  RDW Latest Ref Range: 11.5 - 15.5 % 12.6  Platelets Latest Ref Range: 150 - 400 K/uL 234  Neutrophils Latest Units: % 82  Lymphocytes Latest Units: % 9  Monocytes Relative Latest Units: % 2  Eosinophil Latest Units: % 7  Basophil Latest Units: % 0  NEUT# Latest Ref Range: 1.7 - 7.7 K/uL 7.3  Lymphocyte # Latest Ref Range: 0.7 - 4.0 K/uL 0.8  Monocyte # Latest Ref Range: 0.1 - 1.0 K/uL 0.2  Eosinophils Absolute Latest Ref Range: 0.0 - 0.7 K/uL 0.7  Basophils Absolute Latest Ref Range: 0.0 - 0.1 K/uL 0.0      Results for TYEE, VANDEVOORDE (MRN [id number])   Ref. Range 04/30/2016 13:22  Appearance Latest Ref Range: CLEAR  CLOUDY (A)  Bacteria, UA Latest Ref Range: NONE SEEN  FEW (A)  Bilirubin Urine Latest Ref Range: NEGATIVE  NEGATIVE  Color, Urine Latest Ref Range: YELLOW  YELLOW  Glucose Latest Ref Range: NEGATIVE mg/dL NEGATIVE  Hgb urine dipstick Latest Ref Range: NEGATIVE  LARGE (A)  Ketones, ur Latest Ref Range: NEGATIVE mg/dL NEGATIVE  Leukocytes, UA Latest Ref Range: NEGATIVE  TRACE (A)  Nitrite Latest Ref Range: NEGATIVE  NEGATIVE  pH Latest Ref Range: 5.0 - 8.0  6.0  Protein Latest Ref Range: NEGATIVE mg/dL NEGATIVE  RBC / HPF Latest Ref Range: 0 - 5 RBC/hpf TOO NUMEROUS TO C...  Specific Gravity, Urine  Latest Ref Range: 1.005 - 1.030  1.010  Squamous Epithelial / LPF Latest Ref Range: NONE SEEN  0-5 (A)  WBC, UA Latest Ref Range: 0 - 5  WBC/hpf 0-5    RADIOGRAPHIC STUDIES: I have personally reviewed the radiological images as listed and agreed with the findings in the report. Study Result   CLINICAL DATA:  Right flank pain. History of right hydronephrosis with known obstructing right pelvic adenopathy.  EXAM: RENAL / URINARY TRACT ULTRASOUND COMPLETE  COMPARISON:  04/01/2016 PET-CT.  FINDINGS: Right Kidney:  Length: 15.1 cm. Mildly echogenic right kidney. Mild to moderate right hydronephrosis. No right renal mass.  Left Kidney:  Length: 14.4 cm. Mildly echogenic left kidney. No left hydronephrosis. Cystic structures in the lower left renal pelvis represent parapelvic renal cysts as seen on 03/22/2016 delayed CT abdomen images. No left renal mass.  Bladder:  Appears normal for degree of bladder distention. No right ureteral jet. Normal left ureteral jet.  IMPRESSION: 1. Mild to moderate right hydronephrosis, stable since PET-CT from 1 day prior. Absence of right ureteral jet. Findings are consistent with right ureteral obstruction by known infiltrative right pelvic adenopathy. 2. No left hydronephrosis.  Left parapelvic renal cysts. 3. Mildly echogenic kidneys, indicating nonspecific renal parenchymal disease of uncertain chronicity . 4. Normal bladder.   Electronically Signed   By: Ilona Sorrel M.D.   On: 04/02/2016 14:37    Study Result   CLINICAL DATA:  Initial treatment strategy for mesenteric, retroperitoneal and bilateral pelvic lymphadenopathy seen on recent CT abdomen/pelvis study.  EXAM: NUCLEAR MEDICINE PET SKULL BASE TO THIGH  TECHNIQUE: 11.3 mCi F-18 FDG was injected intravenously. Full-ring PET imaging was performed from the skull base to thigh after the radiotracer. CT data was obtained and used for attenuation correction and  anatomic localization.  FASTING BLOOD GLUCOSE:  Value: 87 mg/dl  COMPARISON:  03/22/2016 CT abdomen/pelvis.  05/27/2015 chest CT.  FINDINGS: NECK  No hypermetabolic lymph nodes in the neck.  CHEST  No hypermetabolic axillary, mediastinal or hilar nodes. No pleural effusions. No acute consolidative airspace disease, lung masses or significant pulmonary nodules.  ABDOMEN/PELVIS  Intensely hypermetabolic conglomerate left mesenteric node measuring 2.7 cm short axis with max SUV 33.9 (series 4/image 154).  There is intensely hypermetabolic confluent bulky adenopathy in the lower aortocaval region extending into the bilateral common iliac region, measuring 3.4 cm short axis with max SUV 36.2 (series 4/image 155).  There is intensely hypermetabolic infiltrative soft tissue throughout the bilateral extraperitoneal pelvis and less prominently in the bilateral retroperitoneum, with associated confluent adenopathy in the right common / external / internal iliac chains with max SUV 35.4 with largest short axis diameter 3.4 cm (series 4/image 163). There is associated stable mild right hydroureteronephrosis to the level of the confluent right common iliac adenopathy. Right deep pelvic sidewall infiltrative soft tissue measures up to the 1.6 cm thickness and demonstrates max SUV 26.6 (series 4/image 192). Right retrocaval region infiltrative soft tissue measures up to 0.7 cm thickness in demonstrates max SUV 13.9 (series 4/ image 125). Presacral space infiltrative soft tissue measures up to 1.0 cm thickness and demonstrates max SUV 15.6 (series 4/image 190). Left external iliac region infiltrative soft tissue measures up to 01.5 cm thickness and demonstrates max SUV 25.2 (series 4/image 188).  No abnormal hypermetabolic activity within the liver, pancreas, adrenal glands, or spleen. Top-normal size prostate with nonspecific internal prostatic calcifications. No left  hydronephrosis. Stable left parapelvic renal cysts.  SKELETON  No focal hypermetabolic activity to suggest skeletal metastasis.  IMPRESSION: 1. Intensely hypermetabolic bulky left mesenteric, aortocaval, bilateral common iliac and right external/internal iliac adenopathy. Intensely hypermetabolic infiltrative soft tissue throughout the bilateral extraperitoneal  pelvis and less prominently in the bilateral retroperitoneum. 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 2. No hypermetabolic disease in the neck, chest or skeleton. 3. Stable mild right hydroureteronephrosis to the level of the bulky right common iliac adenopathy.   Electronically Signed   By: Ilona Sorrel M.D.   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 He also received intrathecal methotrexate on 04/25/2016.  Labs reviewed with the patient. Results are noted above.   He complains of hematuria. A urinalysis was performed. Based on these results, I have written prescription antibiotic Cipro for urinary tract infection. He is allergic to penicillin. I have also sent his urine sample for culture.  I spoke with the patient about situational depression. We will continue to discuss this at future visits.   He does not need any refills at this time.   He will tentatively return for follow up in 4 weeks. He will continue to follow at [facility] and receive chemotherapy there. I advised the patient we can arrange for any labs here if needed.      All questions were answered. The patient knows to call the clinic with any problems, questions or concerns.  I spent 60 minutes counseling the patient face to face. The total time spent in the  appointment was 80 minutes and more than 50% was on counseling and review of test results  This document serves as a record of services personally performed by Ancil Linsey, MD. It was created on her behalf by Arlyce Harman, a trained medical scribe. The creation of this record is based on the scribe's personal observations and the provider's statements to them. This document has been checked and approved by the attending provider.  I have reviewed the above documentation for accuracy and completeness and I agree with the above.  This note was electronically signed.    Molli Hazard, MD  04/30/2016 1:21 PM

## 2016-05-01 LAB — URINE CULTURE: Culture: NO GROWTH

## 2016-05-04 ENCOUNTER — Encounter (HOSPITAL_COMMUNITY): Payer: 59

## 2016-05-04 ENCOUNTER — Other Ambulatory Visit: Payer: Self-pay | Admitting: Family Medicine

## 2016-05-04 DIAGNOSIS — C8333 Diffuse large B-cell lymphoma, intra-abdominal lymph nodes: Secondary | ICD-10-CM | POA: Diagnosis not present

## 2016-05-04 LAB — COMPREHENSIVE METABOLIC PANEL
ALBUMIN: 3.7 g/dL (ref 3.5–5.0)
ALT: 30 U/L (ref 17–63)
AST: 21 U/L (ref 15–41)
Alkaline Phosphatase: 83 U/L (ref 38–126)
Anion gap: 5 (ref 5–15)
BUN: 14 mg/dL (ref 6–20)
CHLORIDE: 105 mmol/L (ref 101–111)
CO2: 28 mmol/L (ref 22–32)
CREATININE: 1.07 mg/dL (ref 0.61–1.24)
Calcium: 8.8 mg/dL — ABNORMAL LOW (ref 8.9–10.3)
GFR calc Af Amer: >60 mL/min (ref >60)
GFR calc non Af Amer: >60 mL/min (ref >60)
Glucose, Bld: 116 mg/dL — ABNORMAL HIGH (ref 65–99)
POTASSIUM: 3.6 mmol/L (ref 3.5–5.1)
Sodium: 138 mmol/L (ref 135–145)
Total Bilirubin: 0.4 mg/dL (ref 0.3–1.2)
Total Protein: 6.4 g/dL — ABNORMAL LOW (ref 6.5–8.1)

## 2016-05-04 LAB — CBC WITH DIFFERENTIAL/PLATELET
BASOS ABS: 0.2 10*3/uL — AB (ref 0.0–0.1)
Basophils Relative: 1 %
EOS PCT: 2 %
Eosinophils Absolute: 0.4 10*3/uL (ref 0.0–0.7)
HCT: 40.4 % (ref 39.0–52.0)
Hemoglobin: 13.4 g/dL (ref 13.0–17.0)
LYMPHS ABS: 1.8 10*3/uL (ref 0.7–4.0)
Lymphocytes Relative: 10 %
MCH: 28.8 pg (ref 26.0–34.0)
MCHC: 33.2 g/dL (ref 30.0–36.0)
MCV: 86.9 fL (ref 78.0–100.0)
MONO ABS: 1.4 10*3/uL — AB (ref 0.1–1.0)
MONOS PCT: 8 %
NEUTROS PCT: 79 %
Neutro Abs: 14.3 10*3/uL — ABNORMAL HIGH (ref 1.7–7.7)
PLATELETS: 169 10*3/uL (ref 150–400)
RBC: 4.65 MIL/uL (ref 4.22–5.81)
RDW: 12.9 % (ref 11.5–15.5)
WBC: 18.1 10*3/uL — AB (ref 4.0–10.5)

## 2016-05-06 ENCOUNTER — Telehealth (HOSPITAL_COMMUNITY): Payer: Self-pay | Admitting: *Deleted

## 2016-05-06 ENCOUNTER — Other Ambulatory Visit (HOSPITAL_COMMUNITY): Payer: 59

## 2016-05-06 NOTE — Telephone Encounter (Signed)
Please refer back to urology.  Complete Cipro course.

## 2016-05-07 NOTE — Telephone Encounter (Signed)
Pt states that he sees Dr. Jeffie Pollock at [facility]. Pt advised per Kirby Crigler PA-C, to go back to Urologist. Pt verbalized understanding.

## 2016-05-11 ENCOUNTER — Other Ambulatory Visit (HOSPITAL_COMMUNITY): Payer: 59

## 2016-05-12 ENCOUNTER — Encounter (HOSPITAL_COMMUNITY): Payer: Self-pay

## 2016-05-13 ENCOUNTER — Other Ambulatory Visit (HOSPITAL_COMMUNITY): Payer: 59

## 2016-05-18 ENCOUNTER — Encounter (HOSPITAL_COMMUNITY): Payer: 59 | Attending: Hematology & Oncology

## 2016-05-18 ENCOUNTER — Other Ambulatory Visit (HOSPITAL_COMMUNITY): Payer: 59

## 2016-05-18 DIAGNOSIS — C8333 Diffuse large B-cell lymphoma, intra-abdominal lymph nodes: Secondary | ICD-10-CM | POA: Diagnosis present

## 2016-05-18 LAB — COMPREHENSIVE METABOLIC PANEL
ALBUMIN: 3.5 g/dL (ref 3.5–5.0)
ALK PHOS: 68 U/L (ref 38–126)
ALT: 19 U/L (ref 17–63)
AST: 16 U/L (ref 15–41)
Anion gap: 6 (ref 5–15)
BILIRUBIN TOTAL: 0.7 mg/dL (ref 0.3–1.2)
BUN: 24 mg/dL — AB (ref 6–20)
CALCIUM: 8.6 mg/dL — AB (ref 8.9–10.3)
CO2: 25 mmol/L (ref 22–32)
Chloride: 108 mmol/L (ref 101–111)
Creatinine, Ser: 0.79 mg/dL (ref 0.61–1.24)
GFR calc Af Amer: >60 mL/min (ref >60)
GFR calc non Af Amer: >60 mL/min (ref >60)
GLUCOSE: 125 mg/dL — AB (ref 65–99)
POTASSIUM: 4.1 mmol/L (ref 3.5–5.1)
SODIUM: 139 mmol/L (ref 135–145)
TOTAL PROTEIN: 5.6 g/dL — AB (ref 6.5–8.1)

## 2016-05-18 LAB — CBC WITH DIFFERENTIAL/PLATELET
BASOS ABS: 0 10*3/uL (ref 0.0–0.1)
Basophils Relative: 0 %
EOS PCT: 1 %
Eosinophils Absolute: 0.1 10*3/uL (ref 0.0–0.7)
HEMATOCRIT: 32.9 % — AB (ref 39.0–52.0)
HEMOGLOBIN: 10.9 g/dL — AB (ref 13.0–17.0)
LYMPHS PCT: 4 %
Lymphs Abs: 0.5 10*3/uL — ABNORMAL LOW (ref 0.7–4.0)
MCH: 28.8 pg (ref 26.0–34.0)
MCHC: 33.1 g/dL (ref 30.0–36.0)
MCV: 87 fL (ref 78.0–100.0)
MONOS PCT: 0 %
Monocytes Absolute: 0 10*3/uL — ABNORMAL LOW (ref 0.1–1.0)
NEUTROS PCT: 95 %
Neutro Abs: 12.1 10*3/uL — ABNORMAL HIGH (ref 1.7–7.7)
Platelets: 290 10*3/uL (ref 150–400)
RBC: 3.78 MIL/uL — AB (ref 4.22–5.81)
RDW: 14 % (ref 11.5–15.5)
WBC MORPHOLOGY: INCREASED
WBC: 12.7 10*3/uL — AB (ref 4.0–10.5)

## 2016-05-20 ENCOUNTER — Other Ambulatory Visit (HOSPITAL_COMMUNITY): Payer: 59

## 2016-05-20 ENCOUNTER — Other Ambulatory Visit: Payer: Self-pay | Admitting: Urology

## 2016-05-20 DIAGNOSIS — N1330 Unspecified hydronephrosis: Secondary | ICD-10-CM

## 2016-05-21 ENCOUNTER — Encounter (HOSPITAL_COMMUNITY): Payer: 59

## 2016-05-21 DIAGNOSIS — C8333 Diffuse large B-cell lymphoma, intra-abdominal lymph nodes: Secondary | ICD-10-CM

## 2016-05-21 LAB — COMPREHENSIVE METABOLIC PANEL
ALK PHOS: 60 U/L (ref 38–126)
ALT: 23 U/L (ref 17–63)
ANION GAP: 7 (ref 5–15)
AST: 16 U/L (ref 15–41)
Albumin: 3.9 g/dL (ref 3.5–5.0)
BILIRUBIN TOTAL: 0.5 mg/dL (ref 0.3–1.2)
BUN: 19 mg/dL (ref 6–20)
CALCIUM: 9.2 mg/dL (ref 8.9–10.3)
CO2: 28 mmol/L (ref 22–32)
Chloride: 101 mmol/L (ref 101–111)
Creatinine, Ser: 1.08 mg/dL (ref 0.61–1.24)
Glucose, Bld: 100 mg/dL — ABNORMAL HIGH (ref 65–99)
Potassium: 3.9 mmol/L (ref 3.5–5.1)
SODIUM: 136 mmol/L (ref 135–145)
TOTAL PROTEIN: 6.4 g/dL — AB (ref 6.5–8.1)

## 2016-05-21 LAB — CBC WITH DIFFERENTIAL/PLATELET
BASOS PCT: 2 %
Basophils Absolute: 0 10*3/uL (ref 0.0–0.1)
EOS ABS: 0.1 10*3/uL (ref 0.0–0.7)
Eosinophils Relative: 7 %
HCT: 33.7 % — ABNORMAL LOW (ref 39.0–52.0)
HEMOGLOBIN: 11.2 g/dL — AB (ref 13.0–17.0)
LYMPHS PCT: 34 %
Lymphs Abs: 0.7 10*3/uL (ref 0.7–4.0)
MCH: 28.7 pg (ref 26.0–34.0)
MCHC: 33.2 g/dL (ref 30.0–36.0)
MCV: 86.4 fL (ref 78.0–100.0)
Monocytes Absolute: 0.6 10*3/uL (ref 0.1–1.0)
Monocytes Relative: 30 %
NEUTROS ABS: 0.5 10*3/uL — AB (ref 1.7–7.7)
Neutrophils Relative %: 27 %
Platelets: 189 10*3/uL (ref 150–400)
RBC: 3.9 MIL/uL — ABNORMAL LOW (ref 4.22–5.81)
RDW: 13.6 % (ref 11.5–15.5)
WBC: 1.9 10*3/uL — ABNORMAL LOW (ref 4.0–10.5)

## 2016-05-24 ENCOUNTER — Ambulatory Visit (HOSPITAL_COMMUNITY)
Admission: RE | Admit: 2016-05-24 | Discharge: 2016-05-24 | Disposition: A | Discharge status: Home / Self Care | Payer: 59 | Source: Ambulatory Visit | Attending: Urology | Admitting: Urology

## 2016-05-24 ENCOUNTER — Other Ambulatory Visit (HOSPITAL_COMMUNITY): Payer: 59

## 2016-05-24 DIAGNOSIS — N1330 Unspecified hydronephrosis: Secondary | ICD-10-CM | POA: Insufficient documentation

## 2016-05-24 MED ORDER — IOPAMIDOL (ISOVUE-300) INJECTION 61%
100.0000 mL | Freq: Once | INTRAVENOUS | Status: AC | PRN
  Administered 2016-05-24: 100 mL via INTRAVENOUS

## 2016-05-25 ENCOUNTER — Other Ambulatory Visit (HOSPITAL_COMMUNITY): Payer: 59

## 2016-05-26 ENCOUNTER — Ambulatory Visit (HOSPITAL_COMMUNITY): Payer: 59 | Admitting: Hematology & Oncology

## 2016-05-26 ENCOUNTER — Other Ambulatory Visit (HOSPITAL_COMMUNITY): Payer: 59

## 2016-05-28 ENCOUNTER — Other Ambulatory Visit: Payer: Self-pay | Admitting: *Deleted

## 2016-05-29 ENCOUNTER — Encounter (HOSPITAL_COMMUNITY): Payer: Self-pay | Admitting: Hematology & Oncology

## 2016-05-30 MED ORDER — TAMSULOSIN HCL 0.4 MG PO CAPS: 0.4000 mg | ORAL_CAPSULE | Freq: Every day | ORAL | 0 refills | Status: DC

## 2016-06-01 ENCOUNTER — Telehealth (HOSPITAL_COMMUNITY): Payer: Self-pay | Admitting: *Deleted

## 2016-06-01 NOTE — Telephone Encounter (Signed)
Courtney at [facility] said patient is getting chemo this week and needs labs on Tuesday 06/08/16, Friday 06/11/16, and Tuesday 06/15/16. She will fax lab orders.

## 2016-06-07 ENCOUNTER — Ambulatory Visit (HOSPITAL_COMMUNITY): Payer: 59 | Admitting: Hematology & Oncology

## 2016-06-08 ENCOUNTER — Encounter (HOSPITAL_COMMUNITY): Payer: 59 | Attending: Hematology & Oncology

## 2016-06-08 DIAGNOSIS — C8333 Diffuse large B-cell lymphoma, intra-abdominal lymph nodes: Secondary | ICD-10-CM | POA: Diagnosis present

## 2016-06-08 DIAGNOSIS — R319 Hematuria, unspecified: Secondary | ICD-10-CM | POA: Insufficient documentation

## 2016-06-09 ENCOUNTER — Encounter (HOSPITAL_COMMUNITY): Payer: 59

## 2016-06-09 DIAGNOSIS — C8333 Diffuse large B-cell lymphoma, intra-abdominal lymph nodes: Secondary | ICD-10-CM | POA: Diagnosis not present

## 2016-06-09 LAB — CBC WITH DIFFERENTIAL/PLATELET
BASOS ABS: 0 10*3/uL (ref 0.0–0.1)
Basophils Relative: 0 %
EOS ABS: 0 10*3/uL (ref 0.0–0.7)
Eosinophils Relative: 0 %
HCT: 31.9 % — ABNORMAL LOW (ref 39.0–52.0)
Hemoglobin: 10.6 g/dL — ABNORMAL LOW (ref 13.0–17.0)
LYMPHS ABS: 0.2 10*3/uL — AB (ref 0.7–4.0)
Lymphocytes Relative: 2 %
MCH: 29.4 pg (ref 26.0–34.0)
MCHC: 33.2 g/dL (ref 30.0–36.0)
MCV: 88.6 fL (ref 78.0–100.0)
MONO ABS: 0.2 10*3/uL (ref 0.1–1.0)
Monocytes Relative: 2 %
NEUTROS ABS: 9.8 10*3/uL — AB (ref 1.7–7.7)
Neutrophils Relative %: 96 %
PLATELETS: 235 10*3/uL (ref 150–400)
RBC: 3.6 MIL/uL — AB (ref 4.22–5.81)
RDW: 16.2 % — AB (ref 11.5–15.5)
WBC: 10.2 10*3/uL (ref 4.0–10.5)

## 2016-06-09 LAB — COMPREHENSIVE METABOLIC PANEL
ALT: 20 U/L (ref 17–63)
AST: 15 U/L (ref 15–41)
Albumin: 3.9 g/dL (ref 3.5–5.0)
Alkaline Phosphatase: 84 U/L (ref 38–126)
Anion gap: 7 (ref 5–15)
BUN: 17 mg/dL (ref 6–20)
CHLORIDE: 103 mmol/L (ref 101–111)
CO2: 27 mmol/L (ref 22–32)
CREATININE: 0.63 mg/dL (ref 0.61–1.24)
Calcium: 9.2 mg/dL (ref 8.9–10.3)
Glucose, Bld: 115 mg/dL — ABNORMAL HIGH (ref 65–99)
POTASSIUM: 4.1 mmol/L (ref 3.5–5.1)
SODIUM: 137 mmol/L (ref 135–145)
Total Bilirubin: 0.8 mg/dL (ref 0.3–1.2)
Total Protein: 6.3 g/dL — ABNORMAL LOW (ref 6.5–8.1)

## 2016-06-11 ENCOUNTER — Encounter (HOSPITAL_COMMUNITY): Payer: 59

## 2016-06-11 DIAGNOSIS — C8333 Diffuse large B-cell lymphoma, intra-abdominal lymph nodes: Secondary | ICD-10-CM

## 2016-06-11 LAB — COMPREHENSIVE METABOLIC PANEL
ALBUMIN: 3.8 g/dL (ref 3.5–5.0)
ALT: 22 U/L (ref 17–63)
AST: 17 U/L (ref 15–41)
Alkaline Phosphatase: 68 U/L (ref 38–126)
Anion gap: 6 (ref 5–15)
BUN: 18 mg/dL (ref 6–20)
CHLORIDE: 106 mmol/L (ref 101–111)
CO2: 26 mmol/L (ref 22–32)
Calcium: 9 mg/dL (ref 8.9–10.3)
Creatinine, Ser: 0.85 mg/dL (ref 0.61–1.24)
GFR calc Af Amer: >60 mL/min (ref >60)
GFR calc non Af Amer: >60 mL/min (ref >60)
GLUCOSE: 121 mg/dL — AB (ref 65–99)
POTASSIUM: 3.9 mmol/L (ref 3.5–5.1)
Sodium: 138 mmol/L (ref 135–145)
Total Bilirubin: 0.5 mg/dL (ref 0.3–1.2)
Total Protein: 6 g/dL — ABNORMAL LOW (ref 6.5–8.1)

## 2016-06-11 LAB — CBC WITH DIFFERENTIAL/PLATELET
BASOS ABS: 0 10*3/uL (ref 0.0–0.1)
Basophils Relative: 1 %
Eosinophils Absolute: 0 10*3/uL (ref 0.0–0.7)
Eosinophils Relative: 0 %
HEMATOCRIT: 29.5 % — AB (ref 39.0–52.0)
Hemoglobin: 10 g/dL — ABNORMAL LOW (ref 13.0–17.0)
LYMPHS ABS: 0.4 10*3/uL — AB (ref 0.7–4.0)
Lymphocytes Relative: 9 %
MCH: 29.8 pg (ref 26.0–34.0)
MCHC: 33.9 g/dL (ref 30.0–36.0)
MCV: 87.8 fL (ref 78.0–100.0)
MONO ABS: 1.1 10*3/uL — AB (ref 0.1–1.0)
MONOS PCT: 23 %
NEUTROS PCT: 67 %
Neutro Abs: 3.2 10*3/uL (ref 1.7–7.7)
PLATELETS: 192 10*3/uL (ref 150–400)
RBC: 3.36 MIL/uL — AB (ref 4.22–5.81)
RDW: 16 % — AB (ref 11.5–15.5)
WBC MORPHOLOGY: INCREASED
WBC: 4.7 10*3/uL (ref 4.0–10.5)

## 2016-06-14 ENCOUNTER — Encounter (HOSPITAL_COMMUNITY): Payer: Self-pay | Admitting: Hematology & Oncology

## 2016-06-15 ENCOUNTER — Encounter (HOSPITAL_COMMUNITY): Payer: 59

## 2016-06-15 ENCOUNTER — Telehealth (HOSPITAL_COMMUNITY): Payer: Self-pay

## 2016-06-15 DIAGNOSIS — C8333 Diffuse large B-cell lymphoma, intra-abdominal lymph nodes: Secondary | ICD-10-CM

## 2016-06-15 DIAGNOSIS — R05 Cough: Secondary | ICD-10-CM

## 2016-06-15 DIAGNOSIS — R059 Cough, unspecified: Secondary | ICD-10-CM

## 2016-06-15 LAB — CBC WITH DIFFERENTIAL/PLATELET
BASOS ABS: 0.2 10*3/uL — AB (ref 0.0–0.1)
Band Neutrophils: 13 %
Basophils Relative: 1 %
EOS ABS: 0.2 10*3/uL (ref 0.0–0.7)
Eosinophils Relative: 1 %
HCT: 33.4 % — ABNORMAL LOW (ref 39.0–52.0)
HEMOGLOBIN: 10.9 g/dL — AB (ref 13.0–17.0)
LYMPHS ABS: 1.9 10*3/uL (ref 0.7–4.0)
LYMPHS PCT: 11 %
MCH: 29.6 pg (ref 26.0–34.0)
MCHC: 32.6 g/dL (ref 30.0–36.0)
MCV: 90.8 fL (ref 78.0–100.0)
MONO ABS: 1.2 10*3/uL — AB (ref 0.1–1.0)
Metamyelocytes Relative: 1 %
Monocytes Relative: 7 %
NEUTROS ABS: 13.4 10*3/uL — AB (ref 1.7–7.7)
NEUTROS PCT: 66 %
PLATELETS: 117 10*3/uL — AB (ref 150–400)
RBC: 3.68 MIL/uL — AB (ref 4.22–5.81)
RDW: 17.8 % — AB (ref 11.5–15.5)
WBC Morphology: INCREASED
WBC: 16.9 10*3/uL — AB (ref 4.0–10.5)

## 2016-06-15 LAB — COMPREHENSIVE METABOLIC PANEL
ALBUMIN: 3.6 g/dL (ref 3.5–5.0)
ALK PHOS: 80 U/L (ref 38–126)
ALT: 23 U/L (ref 17–63)
ANION GAP: 6 (ref 5–15)
AST: 17 U/L (ref 15–41)
BUN: 13 mg/dL (ref 6–20)
CALCIUM: 9.2 mg/dL (ref 8.9–10.3)
CO2: 29 mmol/L (ref 22–32)
Chloride: 103 mmol/L (ref 101–111)
Creatinine, Ser: 0.86 mg/dL (ref 0.61–1.24)
GFR calc non Af Amer: >60 mL/min (ref >60)
Glucose, Bld: 107 mg/dL — ABNORMAL HIGH (ref 65–99)
POTASSIUM: 4.5 mmol/L (ref 3.5–5.1)
SODIUM: 138 mmol/L (ref 135–145)
Total Bilirubin: 0.6 mg/dL (ref 0.3–1.2)
Total Protein: 6.5 g/dL (ref 6.5–8.1)

## 2016-06-15 MED ORDER — AZITHROMYCIN 250 MG PO TABS: ORAL_TABLET | ORAL | 0 refills | Status: DC

## 2016-06-15 NOTE — Telephone Encounter (Signed)
-----   Message from Epifanio Lesches sent at 06/15/2016  9:20 AM EST ----- Pt has a cough that his wife thinks may turn in to something else. Has an appt to see Dr Wolfgang Phoenix on 06/16/16 but she was hoping he could either be seen here or have  Dr Whitney Muse call in something for him.

## 2016-06-15 NOTE — Telephone Encounter (Signed)
After reviewing with oncologist, Zithromax prescription sent to patients pharmacy. Notified wife, who verbalized understanding.

## 2016-06-16 ENCOUNTER — Ambulatory Visit (INDEPENDENT_AMBULATORY_CARE_PROVIDER_SITE_OTHER): Payer: 59 | Admitting: Nurse Practitioner

## 2016-06-16 VITALS — BP 112/70 | HR 97 | Temp 97.9°F | Ht 73.0 in | Wt 211.2 lb

## 2016-06-16 DIAGNOSIS — J069 Acute upper respiratory infection, unspecified: Secondary | ICD-10-CM

## 2016-06-16 MED ORDER — HYDROCODONE-HOMATROPINE 5-1.5 MG/5ML PO SYRP: 5.0000 mL | ORAL_SOLUTION | ORAL | 0 refills | Status: DC | PRN

## 2016-06-17 ENCOUNTER — Telehealth (HOSPITAL_COMMUNITY): Payer: Self-pay

## 2016-06-17 NOTE — Telephone Encounter (Signed)
-----   Message from Epifanio Lesches sent at 06/16/2016  9:02 AM EST ----- Contact: [phone] Wife Manuela Schwartz has question for Dr Whitney Muse re: her husbands care
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
## 2016-06-18 ENCOUNTER — Encounter: Payer: Self-pay | Admitting: Nurse Practitioner

## 2016-06-18 NOTE — Progress Notes (Signed)
Subjective:  Presents for complaints of increased cough and runny nose over the past week. Is oncologist prescribed a Z-Pak yesterday, took 2 of them last night. No headache sore throat or ear pain. No fever. Is currently undergoing treatment for cancer. No chest pain or shortness of breath. No wheezing.   Objective:   BP 112/70   Pulse 97   Temp 97.9 F (36.6 C) (Oral)   Ht 6\' 1"  (1.854 m)   Wt 211 lb 3.2 oz (95.8 kg)   SpO2 97%   BMI 27.86 kg/m  NAD. Alert, oriented. TMs clear effusion, no erythema. Pharynx injected with PND noted. Neck supple with mild soft anterior adenopathy. Lungs clear. Heart regular rate rhythm.  Assessment: Acute upper respiratory infection  Plan:  Meds ordered this encounter  Medications  . gabapentin (NEURONTIN) 300 MG capsule    Sig: Take 600 mg by mouth at bedtime.  . predniSONE (DELTASONE) 50 MG tablet    Sig: Take 125 mg by mouth daily. Patient takes medication the week before chemo.  . cetirizine (ZYRTEC) 10 MG tablet    Sig: Take 10 mg by mouth daily.  Marland Kitchen HYDROcodone-homatropine (HYCODAN) 5-1.5 MG/5ML syrup    Sig: Take 5 mLs by mouth every 4 (four) hours as needed.    Dispense:  120 mL    Refill:  0    Order Specific Question:   Supervising Provider    Answer:   Mikey Kirschner [[id number]]  Complete Z-Pak as directed. Hycodan syrup for nighttime use. Call our office for recheck with oncology if symptoms worsen or persist.

## 2016-06-29 ENCOUNTER — Encounter (HOSPITAL_COMMUNITY): Payer: 59

## 2016-06-29 DIAGNOSIS — C8333 Diffuse large B-cell lymphoma, intra-abdominal lymph nodes: Secondary | ICD-10-CM

## 2016-06-29 LAB — CBC WITH DIFFERENTIAL/PLATELET
BASOS ABS: 0 10*3/uL (ref 0.0–0.1)
BASOS PCT: 0 %
EOS ABS: 0.1 10*3/uL (ref 0.0–0.7)
Eosinophils Relative: 1 %
HCT: 33 % — ABNORMAL LOW (ref 39.0–52.0)
Hemoglobin: 9.6 g/dL — ABNORMAL LOW (ref 13.0–17.0)
LYMPHS ABS: 0.2 10*3/uL — AB (ref 0.7–4.0)
Lymphocytes Relative: 2 %
MCH: 30.7 pg (ref 26.0–34.0)
MCHC: 29.1 g/dL — AB (ref 30.0–36.0)
MCV: 105.4 fL — ABNORMAL HIGH (ref 78.0–100.0)
Monocytes Absolute: 0 10*3/uL — ABNORMAL LOW (ref 0.1–1.0)
Monocytes Relative: 0 %
NEUTROS ABS: 11.4 10*3/uL — AB (ref 1.7–7.7)
Neutrophils Relative %: 97 %
Platelets: 146 10*3/uL — ABNORMAL LOW (ref 150–400)
RBC: 3.13 MIL/uL — ABNORMAL LOW (ref 4.22–5.81)
RDW: 18 % — AB (ref 11.5–15.5)
WBC: 11.7 10*3/uL — ABNORMAL HIGH (ref 4.0–10.5)

## 2016-06-29 LAB — COMPREHENSIVE METABOLIC PANEL
ALBUMIN: 3.6 g/dL (ref 3.5–5.0)
ALT: 16 U/L — ABNORMAL LOW (ref 17–63)
ANION GAP: 4 — AB (ref 5–15)
AST: 18 U/L (ref 15–41)
Alkaline Phosphatase: 80 U/L (ref 38–126)
BILIRUBIN TOTAL: 0.7 mg/dL (ref 0.3–1.2)
BUN: 16 mg/dL (ref 6–20)
CO2: 27 mmol/L (ref 22–32)
Calcium: 8.9 mg/dL (ref 8.9–10.3)
Chloride: 107 mmol/L (ref 101–111)
Creatinine, Ser: 0.77 mg/dL (ref 0.61–1.24)
GFR calc non Af Amer: >60 mL/min (ref >60)
GLUCOSE: 108 mg/dL — AB (ref 65–99)
POTASSIUM: 4.3 mmol/L (ref 3.5–5.1)
SODIUM: 138 mmol/L (ref 135–145)
TOTAL PROTEIN: 6.1 g/dL — AB (ref 6.5–8.1)

## 2016-07-02 ENCOUNTER — Encounter (HOSPITAL_COMMUNITY): Payer: 59

## 2016-07-02 DIAGNOSIS — C8333 Diffuse large B-cell lymphoma, intra-abdominal lymph nodes: Secondary | ICD-10-CM | POA: Diagnosis not present

## 2016-07-02 LAB — COMPREHENSIVE METABOLIC PANEL
ALK PHOS: 64 U/L (ref 38–126)
ALT: 16 U/L — ABNORMAL LOW (ref 17–63)
ANION GAP: 6 (ref 5–15)
AST: 15 U/L (ref 15–41)
Albumin: 3.6 g/dL (ref 3.5–5.0)
BILIRUBIN TOTAL: 0.4 mg/dL (ref 0.3–1.2)
BUN: 16 mg/dL (ref 6–20)
CALCIUM: 8.6 mg/dL — AB (ref 8.9–10.3)
CO2: 26 mmol/L (ref 22–32)
Chloride: 107 mmol/L (ref 101–111)
Creatinine, Ser: 0.77 mg/dL (ref 0.61–1.24)
GFR calc non Af Amer: >60 mL/min (ref >60)
Glucose, Bld: 110 mg/dL — ABNORMAL HIGH (ref 65–99)
Potassium: 3.9 mmol/L (ref 3.5–5.1)
Sodium: 139 mmol/L (ref 135–145)
TOTAL PROTEIN: 6.1 g/dL — AB (ref 6.5–8.1)

## 2016-07-02 LAB — CBC WITH DIFFERENTIAL/PLATELET
BASOS PCT: 5 %
Basophils Absolute: 0.1 10*3/uL (ref 0.0–0.1)
EOS PCT: 10 %
Eosinophils Absolute: 0.1 10*3/uL (ref 0.0–0.7)
HEMATOCRIT: 25.7 % — AB (ref 39.0–52.0)
HEMOGLOBIN: 8.7 g/dL — AB (ref 13.0–17.0)
LYMPHS PCT: 34 %
Lymphs Abs: 0.3 10*3/uL — ABNORMAL LOW (ref 0.7–4.0)
MCH: 30.2 pg (ref 26.0–34.0)
MCHC: 33.9 g/dL (ref 30.0–36.0)
MCV: 89.2 fL (ref 78.0–100.0)
Monocytes Absolute: 0.3 10*3/uL (ref 0.1–1.0)
Monocytes Relative: 27 %
NEUTROS PCT: 24 %
Neutro Abs: 0.2 10*3/uL — ABNORMAL LOW (ref 1.7–7.7)
Platelets: 109 10*3/uL — ABNORMAL LOW (ref 150–400)
RBC: 2.88 MIL/uL — ABNORMAL LOW (ref 4.22–5.81)
RDW: 16.6 % — ABNORMAL HIGH (ref 11.5–15.5)
WBC: 1 10*3/uL — CL (ref 4.0–10.5)

## 2016-07-02 NOTE — Progress Notes (Unsigned)
CRITICAL VALUE ALERT Critical value received:  WBC 1 Date of notification:  07/02/2016 Time of notification: 1032 Critical value read back:  Yes.   Nurse who received alert:  C.Page RN MD notified (1st page):  [phone]

## 2016-07-06 ENCOUNTER — Encounter (HOSPITAL_COMMUNITY): Payer: 59 | Attending: Hematology & Oncology | Admitting: Hematology & Oncology

## 2016-07-06 ENCOUNTER — Encounter (HOSPITAL_COMMUNITY): Payer: 59

## 2016-07-06 ENCOUNTER — Encounter (HOSPITAL_COMMUNITY): Payer: Self-pay | Admitting: Hematology & Oncology

## 2016-07-06 VITALS — BP 128/82 | HR 78 | Temp 97.7°F | Resp 16 | Wt 220.9 lb

## 2016-07-06 DIAGNOSIS — R319 Hematuria, unspecified: Secondary | ICD-10-CM | POA: Diagnosis not present

## 2016-07-06 DIAGNOSIS — G620 Drug-induced polyneuropathy: Secondary | ICD-10-CM

## 2016-07-06 DIAGNOSIS — C8333 Diffuse large B-cell lymphoma, intra-abdominal lymph nodes: Secondary | ICD-10-CM

## 2016-07-06 DIAGNOSIS — F3181 Bipolar II disorder: Secondary | ICD-10-CM

## 2016-07-06 DIAGNOSIS — C8330 Diffuse large B-cell lymphoma, unspecified site: Secondary | ICD-10-CM

## 2016-07-06 DIAGNOSIS — D6481 Anemia due to antineoplastic chemotherapy: Secondary | ICD-10-CM | POA: Diagnosis not present

## 2016-07-06 DIAGNOSIS — F418 Other specified anxiety disorders: Secondary | ICD-10-CM

## 2016-07-06 DIAGNOSIS — R599 Enlarged lymph nodes, unspecified: Secondary | ICD-10-CM | POA: Diagnosis not present

## 2016-07-06 DIAGNOSIS — M549 Dorsalgia, unspecified: Secondary | ICD-10-CM

## 2016-07-06 DIAGNOSIS — F309 Manic episode, unspecified: Secondary | ICD-10-CM

## 2016-07-06 DIAGNOSIS — T451X5A Adverse effect of antineoplastic and immunosuppressive drugs, initial encounter: Secondary | ICD-10-CM

## 2016-07-06 DIAGNOSIS — R109 Unspecified abdominal pain: Secondary | ICD-10-CM

## 2016-07-06 DIAGNOSIS — N1330 Unspecified hydronephrosis: Secondary | ICD-10-CM

## 2016-07-06 LAB — CBC WITH DIFFERENTIAL/PLATELET
BAND NEUTROPHILS: 19 %
BASOS ABS: 0 10*3/uL (ref 0.0–0.1)
BLASTS: 0 %
Basophils Relative: 0 %
EOS ABS: 0 10*3/uL (ref 0.0–0.7)
Eosinophils Relative: 0 %
HEMATOCRIT: 29.1 % — AB (ref 39.0–52.0)
HEMOGLOBIN: 9.6 g/dL — AB (ref 13.0–17.0)
Lymphocytes Relative: 10 %
Lymphs Abs: 2.4 10*3/uL (ref 0.7–4.0)
MCH: 30.7 pg (ref 26.0–34.0)
MCHC: 33 g/dL (ref 30.0–36.0)
MCV: 93 fL (ref 78.0–100.0)
METAMYELOCYTES PCT: 5 %
MYELOCYTES: 0 %
Monocytes Absolute: 0.7 10*3/uL (ref 0.1–1.0)
Monocytes Relative: 3 %
NEUTROS ABS: 21.3 10*3/uL — AB (ref 1.7–7.7)
Neutrophils Relative %: 63 %
Other: 0 %
Platelets: 200 10*3/uL (ref 150–400)
Promyelocytes Absolute: 0 %
RBC: 3.13 MIL/uL — AB (ref 4.22–5.81)
RDW: 18.7 % — AB (ref 11.5–15.5)
WBC Morphology: INCREASED
WBC: 24.4 10*3/uL — AB (ref 4.0–10.5)
nRBC: 0 /100 WBC

## 2016-07-06 LAB — COMPREHENSIVE METABOLIC PANEL
ALBUMIN: 3.8 g/dL (ref 3.5–5.0)
ALT: 20 U/L (ref 17–63)
ANION GAP: 6 (ref 5–15)
AST: 23 U/L (ref 15–41)
Alkaline Phosphatase: 83 U/L (ref 38–126)
BILIRUBIN TOTAL: 0.3 mg/dL (ref 0.3–1.2)
BUN: 16 mg/dL (ref 6–20)
CO2: 28 mmol/L (ref 22–32)
Calcium: 9.1 mg/dL (ref 8.9–10.3)
Chloride: 105 mmol/L (ref 101–111)
Creatinine, Ser: 0.83 mg/dL (ref 0.61–1.24)
GFR calc Af Amer: >60 mL/min (ref >60)
GFR calc non Af Amer: >60 mL/min (ref >60)
GLUCOSE: 106 mg/dL — AB (ref 65–99)
POTASSIUM: 4.2 mmol/L (ref 3.5–5.1)
SODIUM: 139 mmol/L (ref 135–145)
Total Protein: 6.4 g/dL — ABNORMAL LOW (ref 6.5–8.1)
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
## 2016-07-06 NOTE — Patient Instructions (Addendum)
[place] at [facility] Discharge Instructions  RECOMMENDATIONS MADE BY THE CONSULTANT AND ANY TEST RESULTS WILL BE SENT TO YOUR REFERRING PHYSICIAN.  You were seen today by Dr. Whitney Muse Follow up in 8 weeks  Continue labs as ordered by [facility]   Thank you for choosing [place] at [facility] to provide your oncology and hematology care.  To afford each patient quality time with our provider, please arrive at least 15 minutes before your scheduled appointment time.    If you have a lab appointment with the [facility] please come in thru the  Main Entrance and check in at the main information desk  You need to re-schedule your appointment should you arrive 10 or more minutes late.  We strive to give you quality time with our providers, and arriving late affects you and other patients whose appointments are after yours.  Also, if you no show three or more times for appointments you may be dismissed from the clinic at the providers discretion.     Again, thank you for choosing [facility].  Our hope is that these requests will decrease the amount of time that you wait before being seen by our physicians.       _____________________________________________________________  Should you have questions after your visit to [facility], please contact our office at [phone] between the hours of 8:30 a.m. and 4:30 p.m.  Voicemails left after 4:30 p.m. will not be returned until the following business day.  For prescription refill requests, have your pharmacy contact our office.       Resources For Cancer Patients and their Caregivers ? American Cancer Society: Can assist with transportation, wigs, general needs, runs Look Good Feel Better.        (872)249-4010 ? Cancer Care: Provides financial assistance, online support groups, medication/co-pay assistance.  1-800-813-HOPE 507-363-4165) ? [place] Assists [place] cancer patients and their families through emotional , educational and financial support.  (770)191-1940 ? [place] DSS Where to apply for food stamps, Medicaid and utility assistance. (405)678-3292 ? [facility]: Transportation to medical appointments. 269-141-3733 ? Social Security Administration: May apply for disability if have a Stage IV cancer. 603-834-8153 (561) 542-0424 ? [employer], Disability and Transit Services: Assists with nutrition, care and transit needs. [place] Support Programs: @10RELATIVEDAYS @ > Cancer Support Group  2nd Tuesday of the month 1pm-2pm, Journey Room  > Creative Journey  3rd Tuesday of the month 1130am-1pm, Journey Room  > Look Good Feel Better  1st Wednesday of the month 10am-12 noon, Journey Room (Call [place] to register [phone])

## 2016-07-07 ENCOUNTER — Encounter (HOSPITAL_COMMUNITY): Payer: Self-pay | Admitting: Hematology & Oncology

## 2016-07-09 DIAGNOSIS — C8338 Diffuse large B-cell lymphoma, lymph nodes of multiple sites: Secondary | ICD-10-CM | POA: Diagnosis not present

## 2016-07-09 DIAGNOSIS — C8330 Diffuse large B-cell lymphoma, unspecified site: Secondary | ICD-10-CM | POA: Diagnosis not present

## 2016-07-09 DIAGNOSIS — G629 Polyneuropathy, unspecified: Secondary | ICD-10-CM | POA: Diagnosis not present

## 2016-07-09 DIAGNOSIS — N139 Obstructive and reflux uropathy, unspecified: Secondary | ICD-10-CM | POA: Diagnosis not present

## 2016-07-12 DIAGNOSIS — Z5111 Encounter for antineoplastic chemotherapy: Secondary | ICD-10-CM | POA: Diagnosis not present

## 2016-07-12 DIAGNOSIS — C8330 Diffuse large B-cell lymphoma, unspecified site: Secondary | ICD-10-CM | POA: Diagnosis not present

## 2016-07-13 DIAGNOSIS — C8330 Diffuse large B-cell lymphoma, unspecified site: Secondary | ICD-10-CM | POA: Diagnosis not present

## 2016-07-13 DIAGNOSIS — Z880 Allergy status to penicillin: Secondary | ICD-10-CM | POA: Diagnosis not present

## 2016-07-13 DIAGNOSIS — Z5111 Encounter for antineoplastic chemotherapy: Secondary | ICD-10-CM | POA: Diagnosis not present

## 2016-07-14 DIAGNOSIS — C8330 Diffuse large B-cell lymphoma, unspecified site: Secondary | ICD-10-CM | POA: Diagnosis not present

## 2016-07-14 DIAGNOSIS — Z5112 Encounter for antineoplastic immunotherapy: Secondary | ICD-10-CM | POA: Diagnosis not present

## 2016-07-14 DIAGNOSIS — Z5111 Encounter for antineoplastic chemotherapy: Secondary | ICD-10-CM | POA: Diagnosis not present

## 2016-07-15 DIAGNOSIS — C8330 Diffuse large B-cell lymphoma, unspecified site: Secondary | ICD-10-CM | POA: Diagnosis not present

## 2016-07-15 DIAGNOSIS — Z5111 Encounter for antineoplastic chemotherapy: Secondary | ICD-10-CM | POA: Diagnosis not present

## 2016-07-15 DIAGNOSIS — Z5112 Encounter for antineoplastic immunotherapy: Secondary | ICD-10-CM | POA: Diagnosis not present

## 2016-07-16 DIAGNOSIS — Z5111 Encounter for antineoplastic chemotherapy: Secondary | ICD-10-CM | POA: Diagnosis not present

## 2016-07-16 DIAGNOSIS — Z5112 Encounter for antineoplastic immunotherapy: Secondary | ICD-10-CM | POA: Diagnosis not present

## 2016-07-16 DIAGNOSIS — C8330 Diffuse large B-cell lymphoma, unspecified site: Secondary | ICD-10-CM | POA: Diagnosis not present

## 2016-07-19 ENCOUNTER — Ambulatory Visit (HOSPITAL_COMMUNITY)
Admission: RE | Admit: 2016-07-19 | Discharge: 2016-07-19 | Disposition: A | Discharge status: Home / Self Care | Payer: 59 | Source: Ambulatory Visit | Attending: Family Medicine | Admitting: Family Medicine

## 2016-07-19 ENCOUNTER — Ambulatory Visit (INDEPENDENT_AMBULATORY_CARE_PROVIDER_SITE_OTHER): Payer: 59 | Admitting: Family Medicine

## 2016-07-19 VITALS — BP 128/82 | Wt 225.6 lb

## 2016-07-19 DIAGNOSIS — S99921A Unspecified injury of right foot, initial encounter: Secondary | ICD-10-CM | POA: Insufficient documentation

## 2016-07-19 DIAGNOSIS — S92424A Nondisplaced fracture of distal phalanx of right great toe, initial encounter for closed fracture: Secondary | ICD-10-CM | POA: Diagnosis not present

## 2016-07-19 DIAGNOSIS — S97101A Crushing injury of unspecified right toe(s), initial encounter: Secondary | ICD-10-CM | POA: Insufficient documentation

## 2016-07-19 DIAGNOSIS — X58XXXA Exposure to other specified factors, initial encounter: Secondary | ICD-10-CM | POA: Insufficient documentation

## 2016-07-19 MED ORDER — CEPHALEXIN 500 MG PO CAPS: 500.0000 mg | ORAL_CAPSULE | Freq: Four times a day (QID) | ORAL | 0 refills | Status: AC
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
## 2016-07-20 ENCOUNTER — Encounter (HOSPITAL_COMMUNITY): Payer: 59

## 2016-07-20 DIAGNOSIS — Z85828 Personal history of other malignant neoplasm of skin: Secondary | ICD-10-CM | POA: Diagnosis not present

## 2016-07-20 DIAGNOSIS — L821 Other seborrheic keratosis: Secondary | ICD-10-CM | POA: Diagnosis not present

## 2016-07-20 DIAGNOSIS — C8333 Diffuse large B-cell lymphoma, intra-abdominal lymph nodes: Secondary | ICD-10-CM

## 2016-07-20 DIAGNOSIS — D225 Melanocytic nevi of trunk: Secondary | ICD-10-CM | POA: Diagnosis not present

## 2016-07-20 LAB — COMPREHENSIVE METABOLIC PANEL
ALT: 14 U/L — ABNORMAL LOW (ref 17–63)
ANION GAP: 5 (ref 5–15)
AST: 18 U/L (ref 15–41)
Albumin: 4 g/dL (ref 3.5–5.0)
Alkaline Phosphatase: 82 U/L (ref 38–126)
BUN: 21 mg/dL — ABNORMAL HIGH (ref 6–20)
CHLORIDE: 105 mmol/L (ref 101–111)
CO2: 27 mmol/L (ref 22–32)
CREATININE: 0.72 mg/dL (ref 0.61–1.24)
Calcium: 9 mg/dL (ref 8.9–10.3)
Glucose, Bld: 105 mg/dL — ABNORMAL HIGH (ref 65–99)
POTASSIUM: 4.3 mmol/L (ref 3.5–5.1)
Sodium: 137 mmol/L (ref 135–145)
Total Bilirubin: 0.4 mg/dL (ref 0.3–1.2)
Total Protein: 6.5 g/dL (ref 6.5–8.1)

## 2016-07-20 LAB — CBC WITH DIFFERENTIAL/PLATELET
BASOS PCT: 0 %
Basophils Absolute: 0 10*3/uL (ref 0.0–0.1)
EOS ABS: 0 10*3/uL (ref 0.0–0.7)
EOS PCT: 0 %
HCT: 29.8 % — ABNORMAL LOW (ref 39.0–52.0)
Hemoglobin: 9.9 g/dL — ABNORMAL LOW (ref 13.0–17.0)
LYMPHS ABS: 0.2 10*3/uL — AB (ref 0.7–4.0)
Lymphocytes Relative: 2 %
MCH: 31.1 pg (ref 26.0–34.0)
MCHC: 33.2 g/dL (ref 30.0–36.0)
MCV: 93.7 fL (ref 78.0–100.0)
MONO ABS: 0.1 10*3/uL (ref 0.1–1.0)
Monocytes Relative: 1 %
NEUTROS ABS: 9.1 10*3/uL — AB (ref 1.7–7.7)
Neutrophils Relative %: 97 %
PLATELETS: 191 10*3/uL (ref 150–400)
RBC: 3.18 MIL/uL — ABNORMAL LOW (ref 4.22–5.81)
RDW: 18.2 % — AB (ref 11.5–15.5)
WBC: 9.4 10*3/uL (ref 4.0–10.5)

## 2016-07-22 ENCOUNTER — Other Ambulatory Visit (HOSPITAL_COMMUNITY): Payer: Self-pay | Admitting: *Deleted

## 2016-07-22 DIAGNOSIS — C8330 Diffuse large B-cell lymphoma, unspecified site: Secondary | ICD-10-CM

## 2016-07-23 ENCOUNTER — Encounter (HOSPITAL_COMMUNITY): Payer: 59

## 2016-07-23 DIAGNOSIS — C8330 Diffuse large B-cell lymphoma, unspecified site: Secondary | ICD-10-CM

## 2016-07-23 DIAGNOSIS — C8333 Diffuse large B-cell lymphoma, intra-abdominal lymph nodes: Secondary | ICD-10-CM | POA: Diagnosis not present

## 2016-07-23 LAB — CBC WITH DIFFERENTIAL/PLATELET
BASOS PCT: 8 %
Basophils Absolute: 0.1 10*3/uL (ref 0.0–0.1)
EOS PCT: 4 %
Eosinophils Absolute: 0 10*3/uL (ref 0.0–0.7)
HCT: 28.1 % — ABNORMAL LOW (ref 39.0–52.0)
HEMOGLOBIN: 9.5 g/dL — AB (ref 13.0–17.0)
LYMPHS PCT: 24 %
Lymphs Abs: 0.3 10*3/uL — ABNORMAL LOW (ref 0.7–4.0)
MCH: 31.4 pg (ref 26.0–34.0)
MCHC: 33.8 g/dL (ref 30.0–36.0)
MCV: 92.7 fL (ref 78.0–100.0)
Monocytes Absolute: 0.3 10*3/uL (ref 0.1–1.0)
Monocytes Relative: 28 %
NEUTROS ABS: 0.4 10*3/uL — AB (ref 1.7–7.7)
NEUTROS PCT: 36 %
Platelets: 87 10*3/uL — ABNORMAL LOW (ref 150–400)
RBC: 3.03 MIL/uL — ABNORMAL LOW (ref 4.22–5.81)
RDW: 17.2 % — ABNORMAL HIGH (ref 11.5–15.5)
WBC: 1.1 10*3/uL — CL (ref 4.0–10.5)

## 2016-07-23 LAB — COMPREHENSIVE METABOLIC PANEL
ALK PHOS: 69 U/L (ref 38–126)
ALT: 14 U/L — AB (ref 17–63)
AST: 15 U/L (ref 15–41)
Albumin: 3.8 g/dL (ref 3.5–5.0)
Anion gap: 7 (ref 5–15)
BILIRUBIN TOTAL: 0.4 mg/dL (ref 0.3–1.2)
BUN: 17 mg/dL (ref 6–20)
CALCIUM: 8.9 mg/dL (ref 8.9–10.3)
CO2: 24 mmol/L (ref 22–32)
CREATININE: 0.72 mg/dL (ref 0.61–1.24)
Chloride: 105 mmol/L (ref 101–111)
Glucose, Bld: 103 mg/dL — ABNORMAL HIGH (ref 65–99)
Potassium: 4 mmol/L (ref 3.5–5.1)
Sodium: 136 mmol/L (ref 135–145)
TOTAL PROTEIN: 6.3 g/dL — AB (ref 6.5–8.1)

## 2016-07-23 NOTE — Progress Notes (Unsigned)
CRITICAL VALUE ALERT Critical value received:  WBC-1.1 Date of notification:  07/23/2016 Time of notification: [id number] Critical value read back:  Yes.   Nurse who received alert:  Loree Fee, RN MD notified (1st page):  Robynn Pane, PA in person, instructs to fax results to Dr. Lonia Blood, no further instruction

## 2016-07-26 ENCOUNTER — Telehealth: Payer: Self-pay | Admitting: Family Medicine

## 2016-07-26 NOTE — Telephone Encounter (Signed)
Pt called stating that his toe is doing much better. Pt wanted [place] to tell Dr. Nicki Reaper that he appreciated his help.

## 2016-07-26 NOTE — Telephone Encounter (Signed)
Noted, thanks!

## 2016-07-27 ENCOUNTER — Encounter (HOSPITAL_COMMUNITY): Payer: 59

## 2016-07-27 DIAGNOSIS — C8330 Diffuse large B-cell lymphoma, unspecified site: Secondary | ICD-10-CM

## 2016-07-27 DIAGNOSIS — C8333 Diffuse large B-cell lymphoma, intra-abdominal lymph nodes: Secondary | ICD-10-CM | POA: Diagnosis not present

## 2016-07-27 LAB — BASIC METABOLIC PANEL
ANION GAP: 5 (ref 5–15)
BUN: 16 mg/dL (ref 6–20)
CALCIUM: 8.7 mg/dL — AB (ref 8.9–10.3)
CO2: 27 mmol/L (ref 22–32)
CREATININE: 0.66 mg/dL (ref 0.61–1.24)
Chloride: 106 mmol/L (ref 101–111)
Glucose, Bld: 114 mg/dL — ABNORMAL HIGH (ref 65–99)
Potassium: 4 mmol/L (ref 3.5–5.1)
Sodium: 138 mmol/L (ref 135–145)

## 2016-07-27 LAB — HEPATIC FUNCTION PANEL
ALBUMIN: 3.7 g/dL (ref 3.5–5.0)
ALT: 15 U/L — ABNORMAL LOW (ref 17–63)
AST: 17 U/L (ref 15–41)
Alkaline Phosphatase: 76 U/L (ref 38–126)
BILIRUBIN DIRECT: 0.1 mg/dL (ref 0.1–0.5)
BILIRUBIN TOTAL: 0.3 mg/dL (ref 0.3–1.2)
Indirect Bilirubin: 0.2 mg/dL — ABNORMAL LOW (ref 0.3–0.9)
Total Protein: 6 g/dL — ABNORMAL LOW (ref 6.5–8.1)

## 2016-07-27 LAB — CBC WITH DIFFERENTIAL/PLATELET
BASOS ABS: 0.2 10*3/uL — AB (ref 0.0–0.1)
Basophils Relative: 1 %
EOS ABS: 0.3 10*3/uL (ref 0.0–0.7)
EOS PCT: 2 %
HEMATOCRIT: 27.6 % — AB (ref 39.0–52.0)
Hemoglobin: 9.1 g/dL — ABNORMAL LOW (ref 13.0–17.0)
LYMPHS ABS: 1.3 10*3/uL (ref 0.7–4.0)
Lymphocytes Relative: 8 %
MCH: 30.8 pg (ref 26.0–34.0)
MCHC: 33 g/dL (ref 30.0–36.0)
MCV: 93.6 fL (ref 78.0–100.0)
MONO ABS: 1.8 10*3/uL — AB (ref 0.1–1.0)
Monocytes Relative: 11 %
NEUTROS PCT: 78 %
Neutro Abs: 13.2 10*3/uL — ABNORMAL HIGH (ref 1.7–7.7)
PLATELETS: 104 10*3/uL — AB (ref 150–400)
RBC: 2.95 MIL/uL — AB (ref 4.22–5.81)
RDW: 18.2 % — AB (ref 11.5–15.5)
WBC: 16.8 10*3/uL — AB (ref 4.0–10.5)

## 2016-07-27 LAB — LIPASE, BLOOD: LIPASE: 19 U/L (ref 11–51)

## 2016-07-29 DIAGNOSIS — N1330 Unspecified hydronephrosis: Secondary | ICD-10-CM | POA: Diagnosis not present

## 2016-07-30 DIAGNOSIS — C8330 Diffuse large B-cell lymphoma, unspecified site: Secondary | ICD-10-CM | POA: Diagnosis not present

## 2016-08-02 DIAGNOSIS — Z5111 Encounter for antineoplastic chemotherapy: Secondary | ICD-10-CM | POA: Diagnosis not present

## 2016-08-02 DIAGNOSIS — Z5112 Encounter for antineoplastic immunotherapy: Secondary | ICD-10-CM | POA: Diagnosis not present

## 2016-08-02 DIAGNOSIS — C8330 Diffuse large B-cell lymphoma, unspecified site: Secondary | ICD-10-CM | POA: Diagnosis not present

## 2016-08-03 DIAGNOSIS — C8330 Diffuse large B-cell lymphoma, unspecified site: Secondary | ICD-10-CM | POA: Diagnosis not present

## 2016-08-03 DIAGNOSIS — Z5112 Encounter for antineoplastic immunotherapy: Secondary | ICD-10-CM | POA: Diagnosis not present

## 2016-08-03 DIAGNOSIS — Z5111 Encounter for antineoplastic chemotherapy: Secondary | ICD-10-CM | POA: Diagnosis not present

## 2016-08-04 DIAGNOSIS — C8338 Diffuse large B-cell lymphoma, lymph nodes of multiple sites: Secondary | ICD-10-CM | POA: Diagnosis not present

## 2016-08-04 DIAGNOSIS — C8330 Diffuse large B-cell lymphoma, unspecified site: Secondary | ICD-10-CM | POA: Diagnosis not present

## 2016-08-04 DIAGNOSIS — Z5111 Encounter for antineoplastic chemotherapy: Secondary | ICD-10-CM | POA: Diagnosis not present

## 2016-08-04 DIAGNOSIS — Z79899 Other long term (current) drug therapy: Secondary | ICD-10-CM | POA: Diagnosis not present

## 2016-08-05 DIAGNOSIS — C8330 Diffuse large B-cell lymphoma, unspecified site: Secondary | ICD-10-CM | POA: Diagnosis not present

## 2016-08-05 DIAGNOSIS — Z5112 Encounter for antineoplastic immunotherapy: Secondary | ICD-10-CM | POA: Diagnosis not present

## 2016-08-05 DIAGNOSIS — Z5111 Encounter for antineoplastic chemotherapy: Secondary | ICD-10-CM | POA: Diagnosis not present

## 2016-08-06 DIAGNOSIS — Z5112 Encounter for antineoplastic immunotherapy: Secondary | ICD-10-CM | POA: Diagnosis not present

## 2016-08-06 DIAGNOSIS — C8330 Diffuse large B-cell lymphoma, unspecified site: Secondary | ICD-10-CM | POA: Diagnosis not present

## 2016-08-09 ENCOUNTER — Other Ambulatory Visit (HOSPITAL_COMMUNITY): Payer: Self-pay | Admitting: *Deleted

## 2016-08-09 DIAGNOSIS — C8330 Diffuse large B-cell lymphoma, unspecified site: Secondary | ICD-10-CM

## 2016-08-10 ENCOUNTER — Other Ambulatory Visit (HOSPITAL_COMMUNITY): Payer: 59

## 2016-08-13 ENCOUNTER — Other Ambulatory Visit (HOSPITAL_COMMUNITY): Payer: 59

## 2016-08-17 ENCOUNTER — Other Ambulatory Visit (HOSPITAL_COMMUNITY): Payer: 59

## 2016-08-20 ENCOUNTER — Other Ambulatory Visit (HOSPITAL_COMMUNITY): Payer: 59

## 2016-08-31 ENCOUNTER — Ambulatory Visit (HOSPITAL_COMMUNITY): Payer: 59

## 2016-09-10 DIAGNOSIS — C8330 Diffuse large B-cell lymphoma, unspecified site: Secondary | ICD-10-CM | POA: Diagnosis not present

## 2016-09-10 DIAGNOSIS — N139 Obstructive and reflux uropathy, unspecified: Secondary | ICD-10-CM | POA: Diagnosis not present

## 2016-09-10 DIAGNOSIS — R590 Localized enlarged lymph nodes: Secondary | ICD-10-CM | POA: Diagnosis not present

## 2016-09-10 DIAGNOSIS — R911 Solitary pulmonary nodule: Secondary | ICD-10-CM | POA: Diagnosis not present

## 2016-09-10 DIAGNOSIS — C8338 Diffuse large B-cell lymphoma, lymph nodes of multiple sites: Secondary | ICD-10-CM | POA: Diagnosis not present

## 2016-09-24 DIAGNOSIS — Z452 Encounter for adjustment and management of vascular access device: Secondary | ICD-10-CM | POA: Diagnosis not present

## 2016-09-24 DIAGNOSIS — Z8572 Personal history of non-Hodgkin lymphomas: Secondary | ICD-10-CM | POA: Diagnosis not present

## 2016-09-24 DIAGNOSIS — G629 Polyneuropathy, unspecified: Secondary | ICD-10-CM | POA: Diagnosis not present

## 2016-10-25 ENCOUNTER — Encounter: Payer: Self-pay | Admitting: Family Medicine

## 2016-10-25 ENCOUNTER — Ambulatory Visit (INDEPENDENT_AMBULATORY_CARE_PROVIDER_SITE_OTHER): Payer: 59 | Admitting: Family Medicine

## 2016-10-25 VITALS — BP 124/82 | Ht 73.0 in | Wt 217.1 lb

## 2016-10-25 DIAGNOSIS — T451X5A Adverse effect of antineoplastic and immunosuppressive drugs, initial encounter: Secondary | ICD-10-CM

## 2016-10-25 DIAGNOSIS — G620 Drug-induced polyneuropathy: Secondary | ICD-10-CM | POA: Diagnosis not present

## 2016-10-25 NOTE — Progress Notes (Signed)
   Subjective:    Patient ID: John Marquez, male    DOB: 06-24-1956, 61 y.o.   MRN: [id number]  HPI Patient is here today to discuss neuropathy in his feet. Patient wants to discuss his disability status also. Patient has no other concerns at this time.  Long discussion was held with the patient today  Patient denies any chest tightness pressure pain shortness of breath. He does relate a lot of burning in his feet makes it somewhat difficult for him to move around. At times he is uncertain how well he can continue to do his job. He would like to go back to his job and try but if he is unable to complete his job he is thinking about possibly coming out on disability. Review of Systems Please see above.    Objective:   Physical Exam Lungs are clear hearts regular pulse normal subjective neuropathy of the feet with abnormal monofilament testing no ulcers seen pulses are good       Assessment & Plan:  Neuropathy in the feet The patient would like to go back to tries job and he is unable to complete his job he is thinking about coming out on disability

## 2016-10-29 NOTE — Progress Notes (Signed)
Referral for [facility] ordered in epic. Patient stated that he wants to see Neurology in [place].

## 2016-10-29 NOTE — Addendum Note (Signed)
Addended by: Launa Grill on: 10/29/2016 09:40 AM   Modules accepted: Orders

## 2016-11-03 ENCOUNTER — Telehealth: Payer: Self-pay | Admitting: Family Medicine

## 2016-11-03 DIAGNOSIS — Z719 Counseling, unspecified: Secondary | ICD-10-CM

## 2016-11-03 NOTE — Telephone Encounter (Signed)
Pt's wife is requesting for a referral to Dr. Sima Matas for counseling  Please initiate referral in system so that I may process (please put in as external so I can make changes as needed)

## 2016-11-04 NOTE — Telephone Encounter (Signed)
Referral put in.

## 2016-11-04 NOTE — Telephone Encounter (Signed)
Please go ahead and put in referral thank you

## 2016-11-08 ENCOUNTER — Telehealth: Payer: Self-pay | Admitting: Family Medicine

## 2016-11-08 NOTE — Telephone Encounter (Signed)
Pt dropped off a return to work form to be filled out. Pt is needing the information from the old form transferred to the new one but with todays date. Forms are in nurse box.

## 2016-11-09 NOTE — Telephone Encounter (Signed)
Form was completed and given to [facility]

## 2016-11-23 ENCOUNTER — Encounter: Payer: 59 | Attending: Psychology | Admitting: Psychology

## 2016-11-23 DIAGNOSIS — Z719 Counseling, unspecified: Secondary | ICD-10-CM | POA: Insufficient documentation

## 2016-11-23 DIAGNOSIS — G629 Polyneuropathy, unspecified: Secondary | ICD-10-CM

## 2016-11-23 DIAGNOSIS — G620 Drug-induced polyneuropathy: Secondary | ICD-10-CM | POA: Diagnosis not present

## 2016-11-23 DIAGNOSIS — Z8572 Personal history of non-Hodgkin lymphomas: Secondary | ICD-10-CM | POA: Insufficient documentation

## 2016-11-23 DIAGNOSIS — F4323 Adjustment disorder with mixed anxiety and depressed mood: Secondary | ICD-10-CM | POA: Diagnosis not present

## 2016-11-23 DIAGNOSIS — Z833 Family history of diabetes mellitus: Secondary | ICD-10-CM | POA: Insufficient documentation

## 2016-11-23 DIAGNOSIS — Z9221 Personal history of antineoplastic chemotherapy: Secondary | ICD-10-CM | POA: Insufficient documentation

## 2016-11-23 DIAGNOSIS — Z801 Family history of malignant neoplasm of trachea, bronchus and lung: Secondary | ICD-10-CM | POA: Insufficient documentation

## 2016-11-23 DIAGNOSIS — Z8249 Family history of ischemic heart disease and other diseases of the circulatory system: Secondary | ICD-10-CM | POA: Insufficient documentation

## 2016-12-02 ENCOUNTER — Encounter: Payer: Self-pay | Admitting: Psychology

## 2016-12-02 NOTE — Progress Notes (Signed)
Neuropsychological Consultation   Patient:   John Marquez   DOB:   20-Sep-1955  MR Number:  [id number]  Location:  [facility] [address] [place] [id number] [place] [address] Dept: [phone]           Date of Service:   11/23/2016  Start Time:   9 AM End Time:   10 AM  Provider/Observer:  Ilean Skill, Psy.D.       Clinical Neuropsychologist       Billing Code/Service: [phone] 4 Units  Chief Complaint:    The patient was referred by Dr. Sallee Lange due to ongoing difficulties with decision-making and the development of depression anxiety. He has not been able to work recently and has run out of his short-term disability and is having a lot of stress financially. The patient reports that these difficulties have been present for the past 2 or 3 months.  Reason for Service:  The patient is a 61 year old Caucasian male that was referred for psychological/neuropsychological consultation. The patient had lymphoma and received chemotherapy. He is done with these chemotherapy treatments. He was on short-term disability but it is run out and he is hoping that he is able to return to work. The patient reports that this chemotherapy produce some neuropathy in his hands and feet. He is taking Lyrica for this issue. The patient reports that he is having symptoms of depression, and issues with decision making. The patient has been increasingly concerned about when he is going to be able to return or not. He reports that he is having intrusive worrying most of the time. He reports problems getting things done in hard to concentrate on tasks. 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 His biggest concern is going back to work and whether they will let him because of the neuropathy in his hands and his feet. The attention and concentration issues are problematic that most of  this may be due to the anxiety and depression although there may be some residual effects on his concentration due to chemotherapy.  Current Status:  The patient describes ongoing issues with depression, anxiety, sleep disturbance, work difficulties, racing thoughts, insomnia, confusion and memory issues, and excessive worrying.  Reliability of Information: Information is derived from 1 hour face-to-face clinical interview with the patient as well as review of available medical records.  Behavioral Observation: John Marquez  presents as a 61 y.o.-year-old Right Caucasian Male who appeared his stated age. his dress was Appropriate and he was Well Groomed and his manners were Appropriate to the situation.  his participation was indicative of Appropriate and Attentive behaviors.  There were not any physical disabilities noted.  he displayed an appropriate level of cooperation and motivation.     Interactions:    Active Appropriate  Attention:   abnormal and attention span appeared shorter than expected for age  Memory:   within normal limits; recent and remote memory intact  Visuo-spatial:  within normal limits  Speech (Volume):  normal  Speech:   normal;   Thought Process:  Coherent and Relevant  Though Content:  WNL;   Orientation:   person, place, time/date and situation  Judgment:   Good  Planning:   Good  Affect:    Anxious  Mood:    Anxious  Insight:   Good  Intelligence:   normal  Marital Status/Living: The patient reports that he  was born and raised in [place]. The patient is married and he has a 48 year old daughter and a 48 year old daughter. He currently lives with his wife.  Current Employment: The patient has been employed for 31 years with [employer] and works as a Banker. He has been out on short-term disability and is hoping to be able to get back to work as soon as possible.  Past Employment:    Substance Use:  No concerns of  substance abuse are reported.    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  Medical History:   Past Medical History:  Diagnosis Date  . Allergic rhinitis   . Cancer ([place])   . 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 Eczema    Winter  . History of melanoma excision    07-23-2015 and 07-31-2015  mid upper back  . Hydronephrosis, right   . Retroperitoneal lymphadenopathy             Abuse/Trauma History: The patient denies any history of abuse or trauma but has had a major stressor with the diagnosis of lymphoma and treatment through chemotherapy.  Psychiatric History:  No prior history of depression or anxiety or other psychiatric illness.  Family Med/Psych History:  Family History  Problem Relation Age of Onset  . Hypertension Father   . Cancer Father        lung  . Dementia Mother   . Diabetes Mother   . Hypertension Brother     Risk of Suicide/Violence: virtually non-existent the patient denies any suicidal ideation or homicidal ideation.  Impression/DX:  The patient is a 61 year old male who was diagnosed with lymphoma and has completed his course of chemotherapy. He does have residual neuropathy in his hands and his feet but these issues are manageable and not progressing. The patient has been having increasing difficulties with decision-making and depression and anxiety. He has been worrying about his ability to return to work. He does own several rental properties and his anxiety and worry is made it very hard for him to keep up with these robberies in activities as well.  Disposition/Plan:  We'll set the patient up for individual psychotherapeutic interventions.  Diagnosis:    Adjustment disorder with mixed anxiety and depressed mood  Neuropathy          Electronically Signed   _______________________ Ilean Skill, Psy.D.

## 2016-12-14 ENCOUNTER — Encounter: Payer: 59 | Admitting: Psychology

## 2016-12-15 DIAGNOSIS — C8330 Diffuse large B-cell lymphoma, unspecified site: Secondary | ICD-10-CM | POA: Diagnosis not present

## 2016-12-17 DIAGNOSIS — C8338 Diffuse large B-cell lymphoma, lymph nodes of multiple sites: Secondary | ICD-10-CM | POA: Diagnosis not present

## 2016-12-17 DIAGNOSIS — M792 Neuralgia and neuritis, unspecified: Secondary | ICD-10-CM | POA: Diagnosis not present

## 2016-12-22 ENCOUNTER — Encounter: Payer: 59 | Admitting: Psychology

## 2016-12-23 DIAGNOSIS — G4700 Insomnia, unspecified: Secondary | ICD-10-CM | POA: Diagnosis not present

## 2016-12-23 DIAGNOSIS — G620 Drug-induced polyneuropathy: Secondary | ICD-10-CM | POA: Diagnosis not present

## 2017-01-17 DIAGNOSIS — D225 Melanocytic nevi of trunk: Secondary | ICD-10-CM | POA: Diagnosis not present

## 2017-01-17 DIAGNOSIS — L812 Freckles: Secondary | ICD-10-CM | POA: Diagnosis not present

## 2017-01-17 DIAGNOSIS — Z85828 Personal history of other malignant neoplasm of skin: Secondary | ICD-10-CM | POA: Diagnosis not present

## 2017-01-21 ENCOUNTER — Encounter: Payer: Self-pay | Admitting: Family Medicine

## 2017-02-10 ENCOUNTER — Encounter: Payer: Self-pay | Admitting: *Deleted

## 2017-02-10 IMAGING — CT CT ABD-PELV W/ CM
2 of 5 series · 15 of 46 positions shown, 17 images · IV contrast (APPLIED)
Comparison: Abdominopelvic CT of 03/22/2016.  PET of 04/01/2016.

CLINICAL DATA: Hydronephrosis and lymphoma diagnosed 2 months ago.
Currently on chemotherapy. Cystoscopy and stent placement.

EXAM:
CT ABDOMEN AND PELVIS WITH CONTRAST
TECHNIQUE: Multidetector CT imaging of the abdomen and pelvis was performed
using the standard protocol following bolus administration of
intravenous contrast.
CONTRAST:  100mL [id number] IOPAMIDOL ([id number]) INJECTION 61%

[Series 2: axial st · axial · 0.78mm/px · z∈[-517,-47]mm · 12 of 108 slices shown, 14 images]
[im 7/108  soft-tissue]
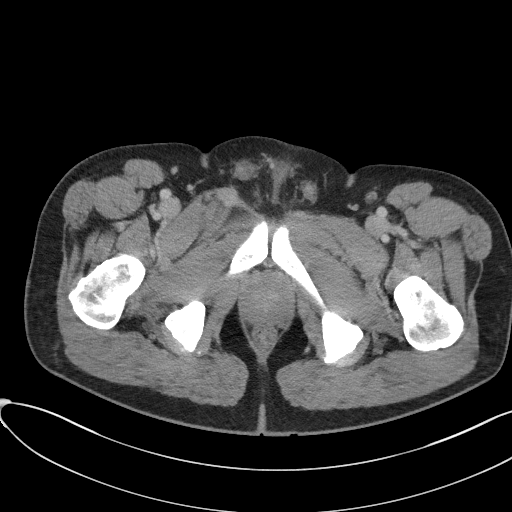
[im 7/108  bone]
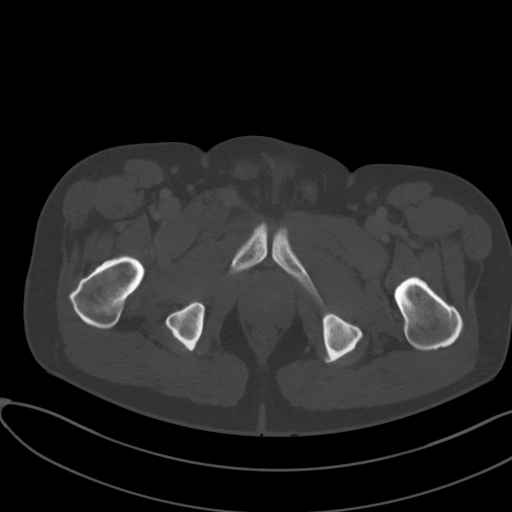
[im 19/108  soft-tissue]
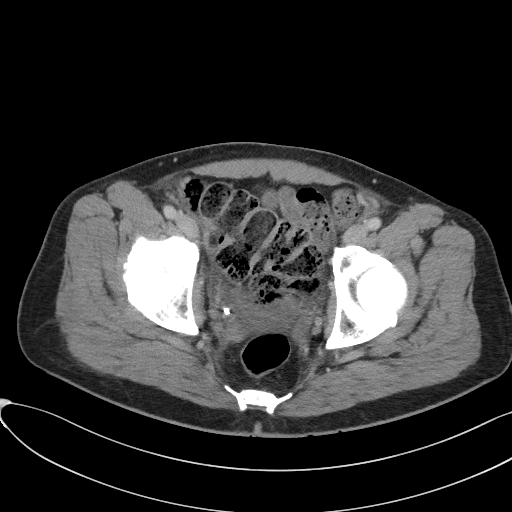
[im 26/108  soft-tissue]
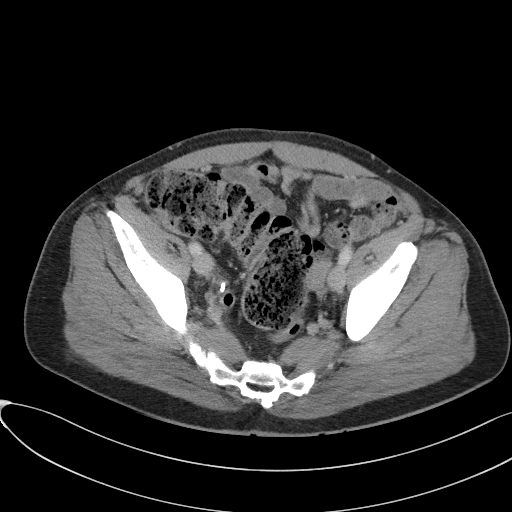
[im 32/108  soft-tissue]
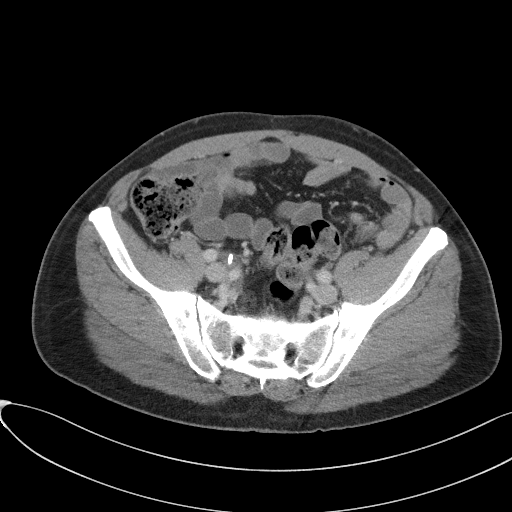
[im 45/108  soft-tissue]
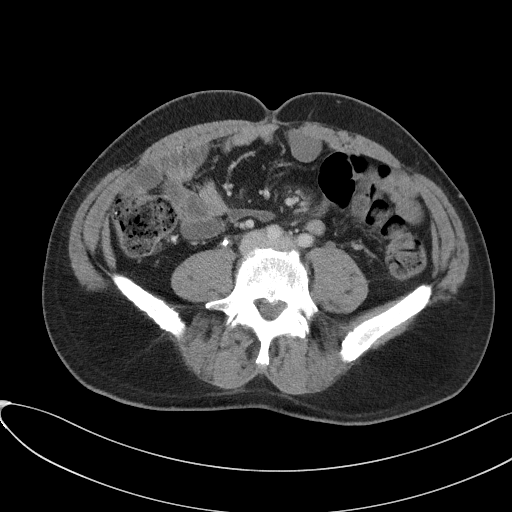
[im 51/108  soft-tissue]
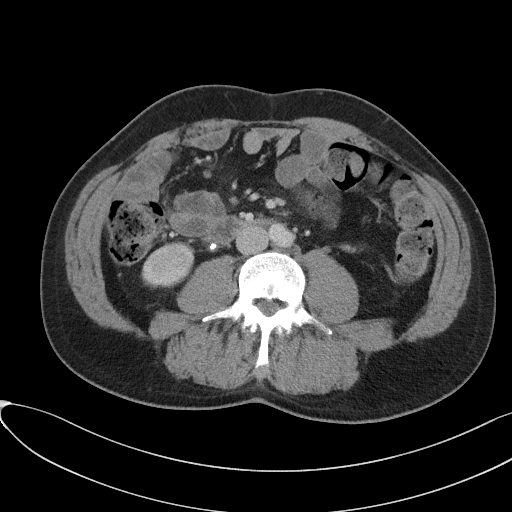
[im 57/108  soft-tissue]
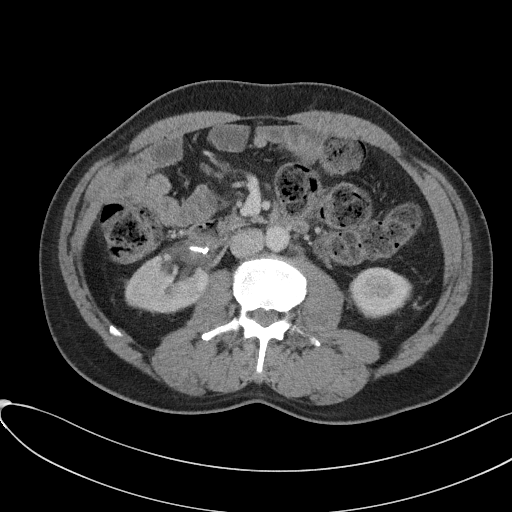
[im 70/108  soft-tissue]
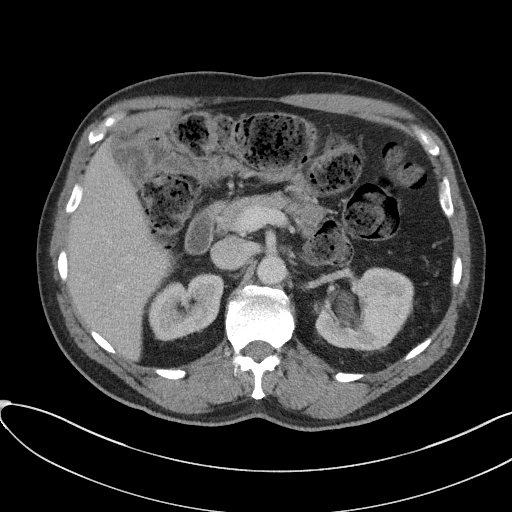
[im 76/108  soft-tissue]
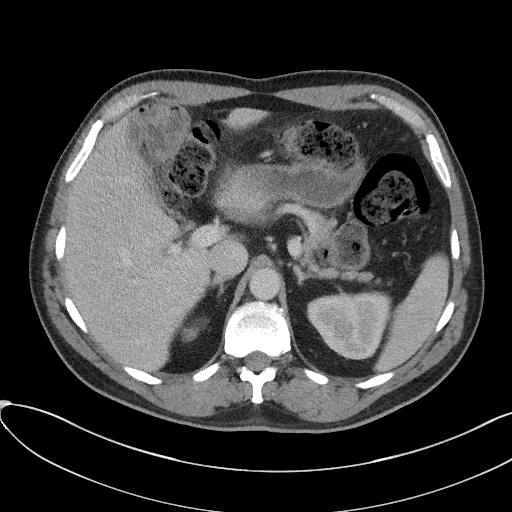
[im 76/108  bone]
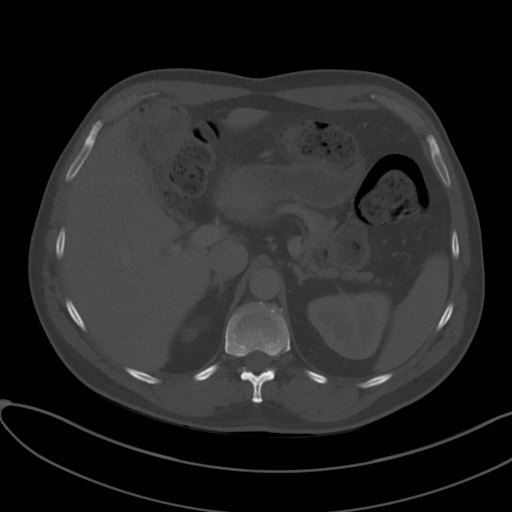
[im 82/108  soft-tissue]
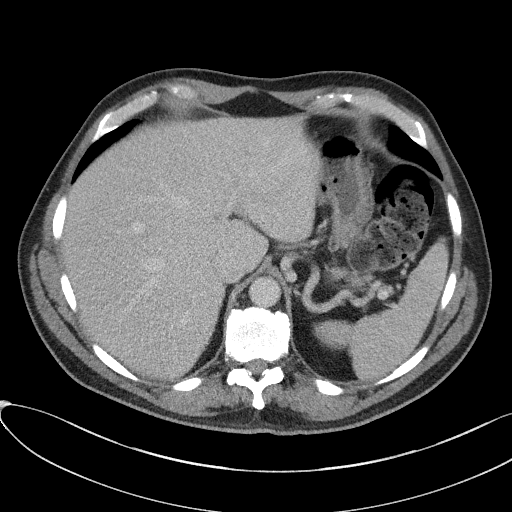
[im 95/108  soft-tissue]
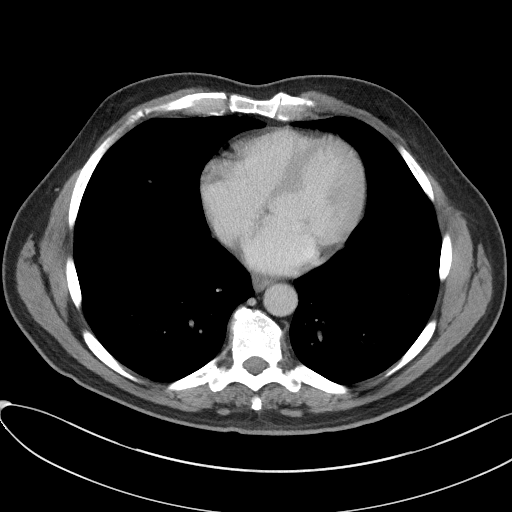
[im 101/108  soft-tissue]
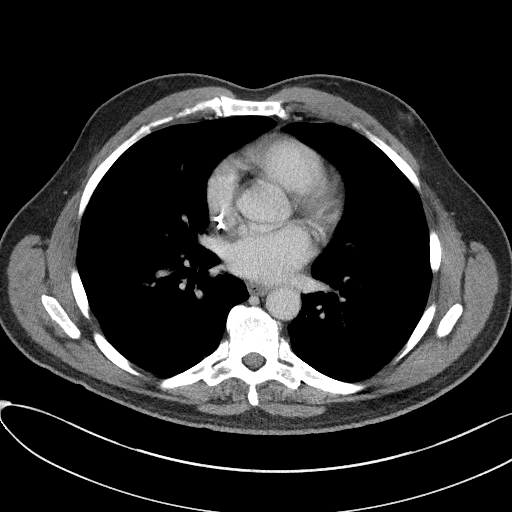

[Series 4: coronal st · coronal · 0.76mm/px · 3 of 100 slices shown]
[im 34/100  soft-tissue]
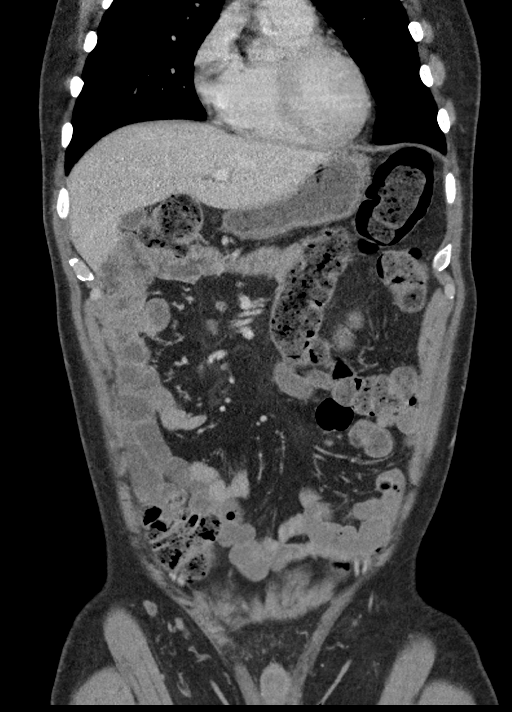
[im 45/100  soft-tissue]
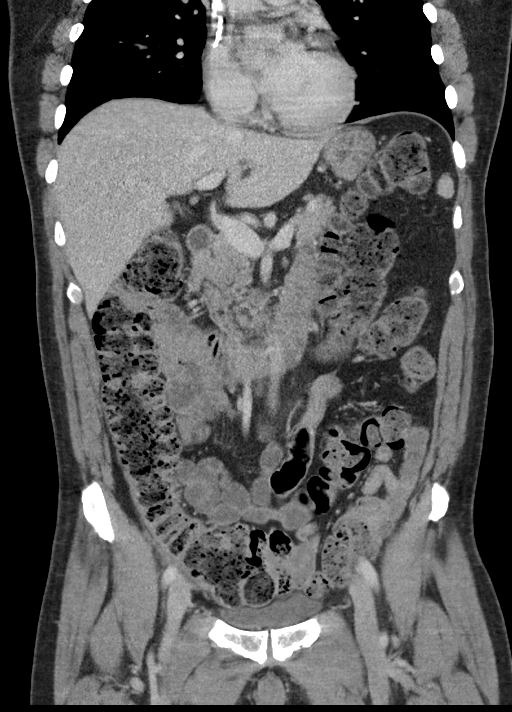
[im 56/100  soft-tissue]
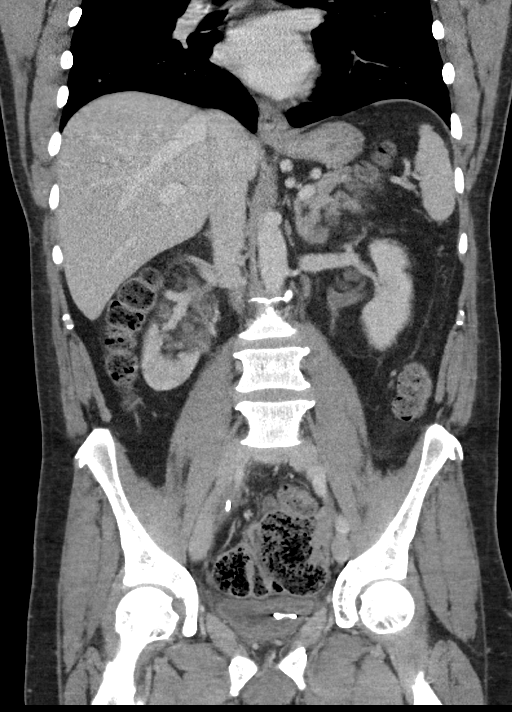

[15 of 46 positions shown; findings below may reference images not displayed]

FINDINGS: Lower chest: Clear lung bases. Normal heart size without pericardial
or pleural effusion. An incompletely imaged central line.

Hepatobiliary: Normal liver. Normal gallbladder, without biliary
ductal dilatation.

Pancreas: Normal, without mass or ductal dilatation.

Spleen: Normal in size, without focal abnormality.

Adrenals/Urinary Tract: Normal adrenal glands. Too small to
characterize lower pole right renal lesion. Left renal sinus cysts.
Right extrarenal pelvis. Placement of a right-sided ureteric stent.
This originates in the right extrarenal pelvis and terminates in the
left-sided urinary bladder. Decrease right-sided corticomedullary
differentiation. Resolution Of right-sided hydroureteronephrosis.

Stomach/Bowel: Normal stomach, without wall thickening. Large
colonic stool burden, especially in the cecum. The cecum is
positioned within the left central pelvis. Normal terminal ileum.
The appendix is positioned within the right posterior pelvis,
including on image 87/series 2.

Normal small bowel.

Vascular/Lymphatic: Resolution of abdominal retroperitoneal
adenopathy. Resolution of small bowel mesenteric masses. No
well-defined residual pelvic adenopathy.

Reproductive: Normal prostate.

Other: No significant free fluid. No evidence of omental or
peritoneal disease.

Musculoskeletal: Left sacroiliac joint degenerative partial fusion.
IMPRESSION: 1. Marked response to therapy, without residual abdominal pelvic
adenopathy.
2. Placement of right ureteric stent, with resolution of
hydroureteronephrosis. There is nonspecific decreased right-sided
corticomedullary differentiation. Correlate with urinalysis to
exclude pyelonephritis. Recommend attention on follow-up.
3.  Possible constipation.

## 2017-02-11 ENCOUNTER — Encounter: Payer: Self-pay | Admitting: Neurology

## 2017-02-11 ENCOUNTER — Ambulatory Visit (INDEPENDENT_AMBULATORY_CARE_PROVIDER_SITE_OTHER): Payer: 59 | Admitting: Neurology

## 2017-02-11 DIAGNOSIS — G629 Polyneuropathy, unspecified: Secondary | ICD-10-CM | POA: Diagnosis not present

## 2017-02-11 DIAGNOSIS — G59 Mononeuropathy in diseases classified elsewhere: Secondary | ICD-10-CM | POA: Diagnosis not present

## 2017-02-11 DIAGNOSIS — Z7689 Persons encountering health services in other specified circumstances: Secondary | ICD-10-CM | POA: Diagnosis not present

## 2017-02-11 MED ORDER — OXCARBAZEPINE 150 MG PO TABS: 150.0000 mg | ORAL_TABLET | Freq: Two times a day (BID) | ORAL | 11 refills | Status: DC

## 2017-02-11 NOTE — Progress Notes (Signed)
PATIENT: John Marquez DOB: 1955/09/03  Chief Complaint  Patient presents with  . New Patient (Initial Visit)    numbness tingling in both feet and fingers after chemo for lymphoma. Referring and PCP: Dr. Karin Golden     HISTORICAL  John Marquez is a 61 years old right-handed male, accompanied by his wife, seen in refer by his primary care physician Dr. Wolfgang Phoenix, [place] for evaluation of peripheral neuropathy, initial evaluation was on February 11 2017.  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  He was treated with chemotherapy R-EPOCH from 04/21/2016, finshed in March 2018, shortly after the chemotherapy around November 2018, he noticed plantar feet numbness, involving toes, progressively worsened with each chemotherapy, plateued since he completed chemotherapy in March 2018,  He now complains of moderate to great toes and plantar feet paresthesia, burning pain during the John, getting worse bearing weight, at evening and nighttime, he complains of moderate to severe pain, discomfort, difficulty sleeping, he also has similar but milder involvement of fingertips,  He has mild unbalanced gait, clumsiness of bilateral hands, difficulty using screwdriver, wrenches.  He denies significant low back pain, no bowel and bladder incontinence,  He also suffered depression, steroid-induced maniac episode during treatmment, is seeing psychologist, Dr. Hampton Abbot, I was able to review her note, he is now taking Zyprexa 10 mg every night, along with Cymbalta 20 mg 3 times a John, his depression is overall under under good control, but he did not notice significant benefit of his neuropathy symptoms, previously he has tried gabapentin up to 1200 mg 3 times a John, Lyrica  150 mg twice a John, he did not notice any benefit.  I was able to review laboratory evaluation in March 2018, decreased WBC 3.6, hemoglobin 13.5, normal CMP with exception of mildly decreased total protein 6.3, glucose was normal 102  Spinal fluid testing January 2018, total protein was 47, glucose was 59, October 2017, negative hepatitis B surface antigen, core IgM, hepatitis A IgM, hepatitis C antibody  REVIEW OF SYSTEMS: Full 14 system review of systems performed and notable only for ringing year, hearing loss, insomnia, joint pain, depression, anxiety, disinterested in activities  ALLERGIES: Allergies  Allergen Reactions  . Penicillins     Patient can tolerate cephalosporins .Marland KitchenHas patient had a PCN reaction causing immediate rash, facial/tongue/throat swelling, SOB or lightheadedness with hypotension: No Has patient had a PCN reaction causing severe rash involving mucus membranes or skin necrosis: No Has patient had a PCN reaction that required hospitalization No Has patient had a PCN reaction occurring within the last 10 years: No If all of the above answers are "NO", then may proceed with Cephalosporin use.   John Marquez [Levofloxacin]     HOME MEDICATIONS: Current Outpatient Prescriptions  Medication Sig Dispense Refill  . DULoxetine (CYMBALTA) 20 MG capsule Take 20 mg by mouth 3 (three) times daily.    Marland Kitchen OLANZapine (ZYPREXA) 10 MG tablet Take 1 tablets at bedtime on the nights of chemotherapy, on days 1-5 of each cycle and then take 1/2 each night after John 5 until next cycle     No current facility-administered medications for this visit.     PAST MEDICAL HISTORY: Past Medical History:  Diagnosis Date  . Allergic rhinitis   . Cancer ([place])   .  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 Eczema    Winter  . History of melanoma excision    07-23-2015 and 07-31-2015  mid upper back  .  Hydronephrosis, right   . Retroperitoneal lymphadenopathy     PAST SURGICAL HISTORY: Past Surgical History:  Procedure Laterality Date  . COLONOSCOPY  2010  . CYSTOSCOPY WITH STENT PLACEMENT Right 04/02/2016   Procedure: CYSTOSCOPY WITH RIGHT RETROGRADE PYELOGRAM AND STENT PLACEMENT;  Surgeon: Bjorn Loser, MD;  Location: [facility];  Service: Urology;  Laterality: Right;    FAMILY HISTORY: Family History  Problem Relation Age of Onset  . Hypertension Father   . Cancer Father        lung  . Dementia Mother   . Diabetes Mother   . Hypertension Brother     SOCIAL HISTORY:  Social History   Social History  . Marital status: Married    Spouse name: N/A  . Number of children: N/A  . Years of education: N/A   Occupational History  . Not on file.   Social History Main Topics  . Smoking status: Never Smoker  . Smokeless tobacco: Never Used  . Alcohol use 0.0 oz/week  . Drug use: No  . Sexual activity: Yes   Other Topics Concern  . Not on file   Social History Narrative  . No narrative on file     PHYSICAL EXAM   Vitals:   02/11/17 0929  BP: 122/81  Pulse: 80  Weight: 217 lb (98.4 kg)  Height: 6\' 1"  (1.854 m)    Not recorded      Body mass index is 28.63 kg/m.  PHYSICAL EXAMNIATION:  Gen: NAD, conversant, well nourised, obese, well groomed                     Cardiovascular: Regular rate rhythm, no peripheral edema, warm, nontender. Eyes: Conjunctivae clear without exudates or hemorrhage Neck: Supple, no carotid bruits. Pulmonary: Clear to auscultation bilaterally   NEUROLOGICAL EXAM:  MENTAL STATUS: Speech:    Speech is normal; fluent and spontaneous with normal comprehension.  Cognition:     Orientation to time, place and person     Normal recent and remote memory     Normal Attention span and concentration     Normal Language, naming, repeating,spontaneous speech     Fund of knowledge   CRANIAL NERVES: CN II: Visual fields are full to  confrontation. Fundoscopic exam is normal with sharp discs and no vascular changes. Pupils are round equal and briskly reactive to light. CN III, IV, VI: extraocular movement are normal. No ptosis. CN V: Facial sensation is intact to pinprick in all 3 divisions bilaterally. Corneal responses are intact.  CN VII: Face is symmetric with normal eye closure and smile. CN VIII: Hearing is normal to rubbing fingers CN IX, X: Palate elevates symmetrically. Phonation is normal. CN XI: Head turning and shoulder shrug are intact CN XII: Tongue is midline with normal movements and no atrophy.  MOTOR: There is no pronator drift of out-stretched arms. Muscle bulk and tone are normal. Muscle strength is normal. With exception of mild bilateral toe extension flexion weakness, right worse than left,  REFLEXES: Reflexes are 1 and symmetric at the biceps, triceps, knees, and absent at ankles. Plantar responses are flexor.  SENSORY: Length dependent decreased to light touch, pinprick, and vibratory sensation to ankle levels  COORDINATION:  Rapid alternating movements and fine finger movements are intact. There is no dysmetria on finger-to-nose and heel-knee-shin.    GAIT/STANCE: Mildly antalgic, difficulty perform tiptoe and heel walking.  Romberg is absent.   DIAGNOSTIC DATA (LABS, IMAGING, TESTING) - I reviewed patient records, labs, notes, testing and imaging myself where available.   ASSESSMENT AND PLAN  John Marquez is a 61 y.o. male    Peripheral neuropathy following his chemotherapy for B-cell lymphoma since October 2017  Symptoms has plateued since stopping chemotherapy in March 2018  Laboratory evaluation to rule out treatable etiology  EMG nerve conduction study Neuropathic pain, chronic insomnia, depression  He is already on Zyprexa 10 mg every night,  Continue Cymbalta 20 mg 3 times a John  Add on Trileptal 150 mg twice a John  Marcial Pacas, M.D. Ph.D.  [facility] [address] [place], [place] [address] Ph: [phone] Fax: [phone]  CC: Referring Provider

## 2017-02-12 LAB — THYROID PANEL WITH TSH
Free Thyroxine Index: 2.3 (ref 1.2–4.9)
T3 UPTAKE RATIO: 31 % (ref 24–39)
T4, Total: 7.3 ug/dL (ref 4.5–12.0)
TSH: 2.33 u[IU]/mL (ref 0.450–4.500)

## 2017-02-12 LAB — VITAMIN D 25 HYDROXY (VIT D DEFICIENCY, FRACTURES): Vit D, 25-Hydroxy: 44.7 ng/mL (ref 30.0–100.0)

## 2017-02-12 LAB — VITAMIN B12: Vitamin B-12: 344 pg/mL (ref 232–1245)

## 2017-02-12 LAB — RPR: RPR: NONREACTIVE

## 2017-02-12 LAB — HGB A1C W/O EAG: Hgb A1c MFr Bld: 5.4 % (ref 4.8–5.6)

## 2017-02-12 LAB — CK: Total CK: 135 U/L (ref 24–204)

## 2017-02-25 ENCOUNTER — Ambulatory Visit (INDEPENDENT_AMBULATORY_CARE_PROVIDER_SITE_OTHER): Payer: 59 | Admitting: Neurology

## 2017-02-25 DIAGNOSIS — G620 Drug-induced polyneuropathy: Secondary | ICD-10-CM

## 2017-02-25 MED ORDER — CAPSAICIN 0.1 % EX CREA: TOPICAL_CREAM | CUTANEOUS | 11 refills | Status: DC

## 2017-02-25 MED ORDER — LIDOCAINE 4 % EX CREA: 1.0000 "application " | TOPICAL_CREAM | CUTANEOUS | 11 refills | Status: DC | PRN

## 2017-02-25 NOTE — Progress Notes (Signed)
PATIENT: John Marquez DOB: 1956/02/18  No chief complaint on file.    HISTORICAL  John Marquez is a 61 years old right-handed male, accompanied by his wife, seen in refer by his primary care physician Dr. Wolfgang Phoenix, [place] for evaluation of peripheral neuropathy, initial evaluation was on February 11 2017.  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  He was treated with chemotherapy R-EPOCH from 04/21/2016, finshed in March 2018, shortly after the chemotherapy around November 2018, he noticed plantar feet numbness, involving toes, progressively worsened with each chemotherapy, plateued since he completed chemotherapy in March 2018,  He now complains of moderate to great toes and plantar feet paresthesia, burning pain during the day, getting worse bearing weight, at evening and nighttime, he complains of moderate to severe pain, discomfort, difficulty sleeping, he also has similar but milder involvement of fingertips,  He has mild unbalanced gait, clumsiness of bilateral hands, difficulty using screwdriver, wrenches.  He denies significant low back pain, no bowel and bladder incontinence,  He also suffered depression, steroid-induced maniac episode during treatmment, is seeing psychologist, Dr. Hampton Abbot, I was able to review her note, he is now taking Zyprexa 10 mg every night, along with Cymbalta 20 mg 3 times a day, his depression is overall under under good control, but he did not notice significant benefit of his neuropathy symptoms, previously he has tried gabapentin up to 1200 mg 3 times a day, Lyrica 150 mg twice a day, he did not notice any benefit.  I was able to review laboratory evaluation in March 2018, decreased WBC 3.6, hemoglobin 13.5,  normal CMP with exception of mildly decreased total protein 6.3, glucose was normal 102  Spinal fluid testing January 2018, total protein was 47, glucose was 59, October 2017, negative hepatitis B surface antigen, core IgM, hepatitis A IgM, hepatitis C antibody  UPDATE February 25 2017: Laboratory evaluation in August 2018 showed normal vitamin D, A1c 5.4, normal thyroid function tests, CPK, B12, RPR,  Electrodiagnostic study today confirmed a mild axonal peripheral neuropathy, there was no evidence of right lumbosacral radiculopathy or right cervical radiculopathy.  He is now on Zyprexa, also Cymbalta 20 mg 3 tablets a day, Trileptal 150 mg twice a day, he continued to suffer neuropathic pain especially at the end of the day.   REVIEW OF SYSTEMS: Full 14 system review of systems performed and notable only for ringing year, hearing loss, insomnia, joint pain, depression, anxiety, disinterested in activities  ALLERGIES: Allergies  Allergen Reactions  . Penicillins     Patient can tolerate cephalosporins .Marland KitchenHas patient had a PCN reaction causing immediate rash, facial/tongue/throat swelling, SOB or lightheadedness with hypotension: No Has patient had a PCN reaction causing severe rash involving mucus membranes or skin necrosis: No Has patient had a PCN reaction that required hospitalization No Has patient had a PCN reaction occurring within the last 10 years: No If all of the above answers are "NO", then may proceed with Cephalosporin use.   John Marquez [Levofloxacin]     HOME MEDICATIONS: Current Outpatient Prescriptions  Medication Sig Dispense Refill  . DULoxetine (CYMBALTA) 20 MG capsule Take 20 mg by mouth 3 (three) times daily.    Marland Kitchen OLANZapine (ZYPREXA) 10 MG tablet Take 1 tablets at bedtime on the nights of chemotherapy, on days  1-5 of each cycle and then take 1/2 each night after day 5 until next cycle    . OXcarbazepine (TRILEPTAL) 150 MG tablet Take 1 tablet (150 mg total) by  mouth 2 (two) times daily. 60 tablet 11   No current facility-administered medications for this visit.     PAST MEDICAL HISTORY: Past Medical History:  Diagnosis Date  . Allergic rhinitis   . Cancer ([place])   . Diffuse large B cell lymphoma, triple HIT ([place]) 03/23/2016  . Diffuse large B cell lymphoma, triple HIT ([place]) 03/23/2016  . Eczema    Winter  . History of melanoma excision    07-23-2015 and 07-31-2015  mid upper back  . Hydronephrosis, right   . Retroperitoneal lymphadenopathy     PAST SURGICAL HISTORY: Past Surgical History:  Procedure Laterality Date  . COLONOSCOPY  2010  . CYSTOSCOPY WITH STENT PLACEMENT Right 04/02/2016   Procedure: CYSTOSCOPY WITH RIGHT RETROGRADE PYELOGRAM AND STENT PLACEMENT;  Surgeon: Bjorn Loser, MD;  Location: [facility];  Service: Urology;  Laterality: Right;    FAMILY HISTORY: Family History  Problem Relation Age of Onset  . Hypertension Father   . Cancer Father        lung  . Dementia Mother   . Diabetes Mother   . Hypertension Brother     SOCIAL HISTORY:  Social History   Social History  . Marital status: Married    Spouse name: N/A  . Number of children: N/A  . Years of education: N/A   Occupational History  . Not on file.   Social History Main Topics  . Smoking status: Never Smoker  . Smokeless tobacco: Never Used  . Alcohol use 0.0 oz/week  . Drug use: No  . Sexual activity: Yes   Other Topics Concern  . Not on file   Social History Narrative  . No narrative on file     PHYSICAL EXAM   There were no vitals filed for this visit.  Not recorded      There is no height or weight on file to calculate BMI.  PHYSICAL EXAMNIATION:  Gen: NAD, conversant, well nourised, obese, well groomed                     Cardiovascular: Regular rate rhythm, no peripheral edema, warm, nontender. Eyes: Conjunctivae clear without exudates or hemorrhage Neck: Supple, no carotid bruits. Pulmonary: Clear to auscultation  bilaterally   NEUROLOGICAL EXAM:  MENTAL STATUS: Speech:    Speech is normal; fluent and spontaneous with normal comprehension.  Cognition:     Orientation to time, place and person     Normal recent and remote memory     Normal Attention span and concentration     Normal Language, naming, repeating,spontaneous speech     Fund of knowledge   CRANIAL NERVES: CN II: Visual fields are full to confrontation. Fundoscopic exam is normal with sharp discs and no vascular changes. Pupils are round equal and briskly reactive to light. CN III, IV, VI: extraocular movement are normal. No ptosis. CN V: Facial sensation is intact to pinprick in all 3 divisions bilaterally. Corneal responses are intact.  CN VII: Face is symmetric with normal eye closure and smile. CN VIII: Hearing is normal to rubbing fingers CN IX, X: Palate elevates symmetrically. Phonation is normal. CN XI: Head turning and shoulder shrug are intact CN XII: Tongue is midline with normal movements and no atrophy.  MOTOR: There is no pronator drift of  out-stretched arms. Muscle bulk and tone are normal. Muscle strength is normal. With exception of mild bilateral toe extension flexion weakness, right worse than left,  REFLEXES: Reflexes are 1 and symmetric at the biceps, triceps, knees, and absent at ankles. Plantar responses are flexor.  SENSORY: Length dependent decreased to light touch, pinprick, and vibratory sensation to ankle levels  COORDINATION: Rapid alternating movements and fine finger movements are intact. There is no dysmetria on finger-to-nose and heel-knee-shin.    GAIT/STANCE: Mildly antalgic, difficulty perform tiptoe and heel walking.  Romberg is absent.   DIAGNOSTIC DATA (LABS, IMAGING, TESTING) - I reviewed patient records, labs, notes, testing and imaging myself where available.   ASSESSMENT AND PLAN  John Marquez is a 61 y.o. male    Peripheral neuropathy following his chemotherapy for B-cell  lymphoma since October 2017  Symptoms has plateued since stopping chemotherapy in March 2018  Laboratory evaluation showed no treatable etiology  EMG nerve conduction study today confirmed mild axonal sensorimotor polyneuropathy  Neuropathic pain, chronic insomnia, depression  He is already on Zyprexa 10 mg every night,  Continue Cymbalta 20 mg 3 times a day   Trileptal 150 mg twice a day  I also suggested vitamin B complex  Add on lidocaine, and Capsaicin cream as needed   Marcial Pacas, M.D. Ph.D.  [facility] [address], [place], [place] [address] Ph: [phone] Fax: [phone]  CC: Referring Provider

## 2017-02-25 NOTE — Procedures (Signed)
Full Name: John Marquez Gender: Male MRN #: [id number] Date of Birth: 19-Jul-2055    Visit Date: 02/25/17 08:11 Age: 61 Years 3 Months Old Examining Physician: Marcial Pacas, MD  Referring Physician: Krista Blue, MD History: 61 year old male with history of chemotherapy, presented with bilateral feet paresthesia started in the middle of his chemotherapy, remains symptomatic  Summary of the test: Nerve conduction study: Bilateral sural, peroneal sensory responses showed mild to moderately decreased snap amplitude. Bilateral peroneal to EDB showed severely decreased C map amplitude. Bilateral tibial motor responses showed normal C map amplitude with mild slow conduction velocity.   Right median, ulnar and radial sensory responses were normal. Right ulnar motor responses showed borderline C map amplitude, normal conduction velocity. Right median motor responses were within normal limits.  Electromyography: Selective needle examinations performed at right upper, lower extremity muscles, right cervical, lumbar sacral paraspinal muscles.  There is evidence of chronic neuropathic changes at right tibialis anterior, there is no evidence of active process.  Conclusion:  This is an abnormal study. There is electrodiagnostic evidence of mild axonal sensorimotor polyneuropathy. There is no evidence of right cervical radiculopathy, or right lumbosacral radiculopathy.   ------------------------------- Marcial Pacas, M.D.  [facility] [place], [place] [address] Tel: [phone] Fax: [phone]        [facility]    Nerve / Sites Muscle Latency Ref. Amplitude Ref. Rel Amp Segments Distance Velocity Ref. Area    ms ms mV mV %  cm m/s m/s mVms  R Median - APB     Wrist APB 4.0 ?4.4 4.8 ?4.0 100 Wrist - APB 7   16.8     Upper arm APB 8.5  4.6  95.6 Upper arm - Wrist 23 51 ?49 16.3  R Ulnar - ADM     Wrist ADM 3.0 ?3.3 5.8 ?6.0 100 Wrist - ADM 7   19.9     B.Elbow ADM 7.3   5.2  89.2 B.Elbow - Wrist 22 50 ?49 18.8     A.Elbow ADM 9.8  5.0  96.8 A.Elbow - B.Elbow 12 49 ?49 17.1         A.Elbow - Wrist      R Peroneal - EDB     Ankle EDB 6.0 ?6.5 0.3 ?2.0 100 Ankle - EDB 9   1.0     Fib head EDB 14.3  0.3  85.8 Fib head - Ankle 32 39 ?44 0.5     Pop fossa EDB 16.9  0.6  213 Pop fossa - Fib head 10 38 ?44 2.2         Pop fossa - Ankle      L Peroneal - EDB     Ankle EDB 6.4 ?6.5 0.5 ?2.0 100 Ankle - EDB 9   0.8     Fib head EDB 14.3  0.1  15.5 Fib head - Ankle 32 41 ?44      Pop fossa EDB 16.8  0.4  492 Pop fossa - Fib head 10 39 ?44 0.6         Pop fossa - Ankle      R Tibial - AH     Ankle AH 5.1 ?5.8 6.0 ?4.0 100 Ankle - AH 9   9.9     Pop fossa AH 16.5  3.1  51.3 Pop fossa - Ankle 43 38 ?41 8.8  L Tibial - AH     Ankle AH 4.9 ?5.8 10.4 ?4.0 100  Ankle - AH 9   19.3     Pop fossa AH 15.9  6.8  65.5 Pop fossa - Ankle 43 39 ?41 16.2                 SNC    Nerve / Sites Rec. Site Peak Lat Ref.  Amp Ref. Segments Distance Peak Diff Ref.    ms ms V V  cm ms ms  R Radial - Anatomical snuff box (Forearm)     Forearm Wrist 2.7 ?2.9 11 ?15 Forearm - Wrist 10    R Sural - Ankle (Calf)     Calf Ankle 4.3 ?4.4 5 ?6 Calf - Ankle 14    L Sural - Ankle (Calf)     Calf Ankle 4.2 ?4.4 4 ?6 Calf - Ankle 14    R Superficial peroneal - Ankle     Lat leg Ankle 4.6 ?4.4 2 ?6 Lat leg - Ankle 14    L Superficial peroneal - Ankle     Lat leg Ankle 4.5 ?4.4 2 ?6 Lat leg - Ankle 14    R Median, Ulnar - Transcarpal comparison     Median Palm Wrist 2.3 ?2.2 16 ?35 Median Palm - Wrist 8       Ulnar Palm Wrist 2.1 ?2.2 5 ?12 Ulnar Palm - Wrist 8          Median Palm - Ulnar Palm  0.2 ?0.4  R Median - Orthodromic (Dig II, Mid palm)     Dig II Wrist 3.2 ?3.4 12 ?10 Dig II - Wrist 13    R Ulnar - Orthodromic, (Dig V, Mid palm)     Dig V Wrist 2.8 ?3.1 5 ?5 Dig V - Wrist 28                       F  Wave    Nerve F Lat Ref.   ms ms  R Tibial - AH 58.5 ?56.0  L Tibial - AH  58.9 ?56.0  R Ulnar - ADM 33.2 ?32.0           EMG full       EMG Summary Table    Spontaneous MUAP Recruitment  Muscle IA Fib PSW Fasc Other Amp Dur. Poly Pattern  R. First dorsal interosseous Normal None None None _______ Normal Normal Normal Normal  R. Pronator teres Normal None None None _______ Normal Normal Normal Normal  R. Deltoid Normal None None None _______ Normal Normal Normal Normal  R. Biceps brachii Normal None None None _______ Normal Normal Normal Normal  R. Cervical paraspinals Normal None None None _______ Normal Normal Normal Normal  R. Tibialis anterior Normal None None None _______ Normal Normal Normal Reduced  R. Tibialis posterior Normal None None None _______ Normal Normal Normal Normal  R. Vastus lateralis Normal None None None _______ Normal Normal Normal Normal  R. Lumbar paraspinals (mid) Normal None None None _______ Normal Normal Normal Normal  R. Lumbar paraspinals (low) Normal None None None _______ Normal Normal Normal Normal

## 2017-03-03 ENCOUNTER — Encounter: Payer: Self-pay | Admitting: Neurology

## 2017-03-04 ENCOUNTER — Telehealth: Payer: Self-pay | Admitting: Neurology

## 2017-03-04 ENCOUNTER — Other Ambulatory Visit: Payer: Self-pay | Admitting: Neurology

## 2017-03-04 MED ORDER — OLANZAPINE 5 MG PO TABS: 5.0000 mg | ORAL_TABLET | Freq: Every day | ORAL | 6 refills | Status: DC

## 2017-03-04 NOTE — Telephone Encounter (Signed)
I update the list  Trileptal 150mg  bid Cymbalta 20mg  bid  I refilled zyprexa 5mg  qhs

## 2017-03-09 ENCOUNTER — Telehealth: Payer: Self-pay | Admitting: Neurology

## 2017-03-09 NOTE — Telephone Encounter (Signed)
error 

## 2017-03-10 ENCOUNTER — Telehealth: Payer: Self-pay | Admitting: *Deleted

## 2017-03-10 MED ORDER — DULOXETINE HCL 60 MG PO CPEP: 60.0000 mg | ORAL_CAPSULE | Freq: Every day | ORAL | 3 refills | Status: DC

## 2017-03-10 NOTE — Telephone Encounter (Signed)
Spoke to patient's wife on HIPAA - states patient would like to try duloxetine 60mg , once daily.  Dr. Krista Blue has approved this prescription and it has been sent to his requested pharmacy.   Email from patient:  I am currently taking 1 capsule 2 times a day. My insurance will only approve  payment of a 90-day supply. If you agree with this dosage, please phone the  refill to the [place] [phone].    Thank you for your treatment!! [name]  ----- Message -----  From: Marcial Pacas, MD  Sent: 03/09/2017 5:19 PM EDT  To: John Marquez  Subject: RE: Visit Follow-Up Question  John Marquez:    How may Duloxetine DR 20mg  capsule do you take now? The common dose are 60mg  capsule once daily.    I can refill it for you.     Marcial Pacas, M.D. Ph.D.

## 2017-03-10 NOTE — Addendum Note (Signed)
Addended by: Desmond Lope on: 03/10/2017 05:33 PM   Modules accepted: Orders

## 2017-03-15 ENCOUNTER — Encounter: Payer: Self-pay | Admitting: Family Medicine

## 2017-03-15 ENCOUNTER — Ambulatory Visit (INDEPENDENT_AMBULATORY_CARE_PROVIDER_SITE_OTHER): Payer: 59 | Admitting: Family Medicine

## 2017-03-15 VITALS — BP 110/68 | Temp 98.1°F | Ht 73.0 in | Wt 222.0 lb

## 2017-03-15 DIAGNOSIS — N63 Unspecified lump in unspecified breast: Secondary | ICD-10-CM

## 2017-03-15 NOTE — Progress Notes (Signed)
   Subjective:    Patient ID: John Marquez, male    DOB: 1956/01/31, 61 y.o.   MRN: [id number]  HPILump on right breast. Came up one month ago. Painful to touch.  This patient has a small lump underneath the right nipple region it feels like a nodule versus a cyst he is never noticed it before he has a history of lymphoma being treated by [facility] oncology patient feels this area is new does cause some slight tenderness when he pushes on it hard denies any other trouble Denies fevers chills weight loss night sweats chest congestion coughing shortness of breath  Review of Systems Please see above    Objective:   Physical Exam Neck no masses are felt lungs are clear no crackles heart regular no murmurs or gallops extremities no edema left pectoralis and breast area normal right pectoralis breast area has a small nodule approximately 4-5 mm underneath the right nipple region       Assessment & Plan:  Patient has a nodule underneath around right nipple region. This needs to be looked into further. I recommend a mammogram with ultrasound. May well even need to have biopsy will discuss with his oncologist at [facility]

## 2017-03-16 ENCOUNTER — Telehealth: Payer: Self-pay | Admitting: Family Medicine

## 2017-03-16 ENCOUNTER — Other Ambulatory Visit: Payer: Self-pay | Admitting: Family Medicine

## 2017-03-16 DIAGNOSIS — R928 Other abnormal and inconclusive findings on diagnostic imaging of breast: Secondary | ICD-10-CM

## 2017-03-16 NOTE — Progress Notes (Signed)
Left message for Dr.Dittus to call Dr.Scott [name]-

## 2017-03-16 NOTE — Telephone Encounter (Signed)
mammo and ultrasound canceled.

## 2017-03-16 NOTE — Telephone Encounter (Signed)
I had discussed his case with the oncologist at [facility]-Dr.Dittus-they have recommended that we cancel the tests in they will reexamine the patient later this month and decide if he needs a PET scan or biopsy. I have informed the patient of this. If the patient should need [place] for anything he will notify us. He voiced understanding. Nurse's-please cancel his diagnostic mammogram and ultrasound

## 2017-03-22 ENCOUNTER — Encounter (HOSPITAL_COMMUNITY): Payer: 59

## 2017-03-25 DIAGNOSIS — G629 Polyneuropathy, unspecified: Secondary | ICD-10-CM | POA: Diagnosis not present

## 2017-03-25 DIAGNOSIS — C8338 Diffuse large B-cell lymphoma, lymph nodes of multiple sites: Secondary | ICD-10-CM | POA: Diagnosis not present

## 2017-03-25 DIAGNOSIS — N63 Unspecified lump in unspecified breast: Secondary | ICD-10-CM | POA: Diagnosis not present

## 2017-03-25 DIAGNOSIS — Z880 Allergy status to penicillin: Secondary | ICD-10-CM | POA: Diagnosis not present

## 2017-04-04 DIAGNOSIS — N6324 Unspecified lump in the left breast, lower inner quadrant: Secondary | ICD-10-CM | POA: Diagnosis not present

## 2017-04-04 DIAGNOSIS — N644 Mastodynia: Secondary | ICD-10-CM | POA: Diagnosis not present

## 2017-04-04 DIAGNOSIS — N6314 Unspecified lump in the right breast, lower inner quadrant: Secondary | ICD-10-CM | POA: Diagnosis not present

## 2017-04-04 DIAGNOSIS — N62 Hypertrophy of breast: Secondary | ICD-10-CM | POA: Diagnosis not present

## 2017-05-30 ENCOUNTER — Other Ambulatory Visit: Payer: Self-pay | Admitting: *Deleted

## 2017-05-30 MED ORDER — OLANZAPINE 5 MG PO TABS: 5.0000 mg | ORAL_TABLET | Freq: Every day | ORAL | 1 refills | Status: DC

## 2017-06-22 ENCOUNTER — Encounter: Payer: 59 | Admitting: Family Medicine

## 2017-06-24 DIAGNOSIS — C8338 Diffuse large B-cell lymphoma, lymph nodes of multiple sites: Secondary | ICD-10-CM | POA: Diagnosis not present

## 2017-06-24 DIAGNOSIS — Z880 Allergy status to penicillin: Secondary | ICD-10-CM | POA: Diagnosis not present

## 2017-06-30 ENCOUNTER — Encounter: Payer: 59 | Admitting: Family Medicine

## 2017-07-15 ENCOUNTER — Encounter: Payer: Self-pay | Admitting: Family Medicine

## 2017-07-15 ENCOUNTER — Ambulatory Visit (INDEPENDENT_AMBULATORY_CARE_PROVIDER_SITE_OTHER): Payer: 59 | Admitting: Family Medicine

## 2017-07-15 VITALS — BP 120/82 | Ht 73.0 in | Wt 225.0 lb

## 2017-07-15 DIAGNOSIS — Z125 Encounter for screening for malignant neoplasm of prostate: Secondary | ICD-10-CM

## 2017-07-15 DIAGNOSIS — Z13220 Encounter for screening for lipoid disorders: Secondary | ICD-10-CM

## 2017-07-15 DIAGNOSIS — N529 Male erectile dysfunction, unspecified: Secondary | ICD-10-CM | POA: Diagnosis not present

## 2017-07-15 DIAGNOSIS — Z79899 Other long term (current) drug therapy: Secondary | ICD-10-CM

## 2017-07-15 DIAGNOSIS — Z0000 Encounter for general adult medical examination without abnormal findings: Secondary | ICD-10-CM | POA: Diagnosis not present

## 2017-07-15 MED ORDER — SILDENAFIL CITRATE 20 MG PO TABS: ORAL_TABLET | ORAL | 3 refills | Status: DC

## 2017-07-15 NOTE — Progress Notes (Signed)
   Subjective:    Patient ID: John Marquez, male    DOB: [birth year], 62 y.o.   MRN: [id number]  HPI  The patient comes in today for a wellness visit.  This patient is gone through treatment for aggressive lymphoma is currently in remission he is being followed on a close basis by [facility] oncology.  He is having some mild depression but he is being treated by therapist and is on medication  He does suffer with neuropathy that is related to the chemotherapy this is more likely a permanent problem  Patient does exercise on a regular basis he does try to watch his oral intake. A review of their health history was completed.  A review of medications was also completed.  Any needed refills; No  Eating habits: Good  Falls/  MVA accidents in past few months: None  Regular exercise: 3-4 days per week  Specialist pt sees on regular basis: Sees therapist for mild depression  Preventative health issues were discussed.   Additional concerns: Bloodwork   Review of Systems  Constitutional: Negative for activity change, appetite change and fever.  HENT: Negative for congestion and rhinorrhea.   Eyes: Negative for discharge.  Respiratory: Negative for cough and wheezing.   Cardiovascular: Negative for chest pain.  Gastrointestinal: Negative for abdominal pain, blood in stool and vomiting.  Genitourinary: Negative for difficulty urinating and frequency.  Musculoskeletal: Negative for neck pain.  Skin: Negative for rash.  Allergic/Immunologic: Negative for environmental allergies and food allergies.  Neurological: Positive for numbness. Negative for weakness and headaches.  Psychiatric/Behavioral: Negative for agitation.       Objective:   Physical Exam  Constitutional: He appears well-developed and well-nourished.  HENT:  Head: Normocephalic and atraumatic.  Right Ear: External ear normal.  Left Ear: External ear normal.  Nose: Nose normal.  Mouth/Throat: Oropharynx is clear and  moist.  Eyes: EOM are normal. Pupils are equal, round, and reactive to light.  Neck: Normal range of motion. Neck supple. No thyromegaly present.  Cardiovascular: Normal rate, regular rhythm and normal heart sounds.  No murmur heard. Pulmonary/Chest: Effort normal and breath sounds normal. No respiratory distress. He has no wheezes.  Abdominal: Soft. Bowel sounds are normal. He exhibits no distension and no mass. There is no tenderness.  Genitourinary: Prostate normal and penis normal.  Musculoskeletal: Normal range of motion. He exhibits no edema.  Lymphadenopathy:    He has no cervical adenopathy.  Neurological: He is alert. He exhibits normal muscle tone.  Skin: Skin is warm and dry. No erythema.  Psychiatric: He has a normal mood and affect. His behavior is normal. Judgment normal.   Patient has had extensive lab work at [facility] this was reviewed but he does need cholesterol and PSA  Prostate exam normal     Assessment & Plan:  Adult wellness-complete.wellness physical was conducted today. Importance of diet and exercise were discussed in detail. In addition to this a discussion regarding safety was also covered. We also reviewed over immunizations and gave recommendations regarding current immunization needed for age. In addition to this additional areas were also touched on including: Preventative health exams needed: Colonoscopy 2020  Patient was advised yearly wellness exam  Patient does have mild erectile dysfunction we talked about this at length patient opts for sildenafil he will let us know if ongoing troubles I discouraged testosterone testing and supplementation

## 2017-07-16 DIAGNOSIS — N529 Male erectile dysfunction, unspecified: Secondary | ICD-10-CM | POA: Insufficient documentation

## 2017-07-16 LAB — LIPID PANEL
CHOL/HDL RATIO: 2.9 ratio (ref 0.0–5.0)
Cholesterol, Total: 178 mg/dL (ref 100–199)
HDL: 61 mg/dL (ref >39)
LDL Calculated: 105 mg/dL — ABNORMAL HIGH (ref 0–99)
Triglycerides: 62 mg/dL (ref 0–149)
VLDL CHOLESTEROL CAL: 12 mg/dL (ref 5–40)

## 2017-07-16 LAB — PSA: PROSTATE SPECIFIC AG, SERUM: 1.6 ng/mL (ref 0.0–4.0)

## 2017-07-17 ENCOUNTER — Encounter: Payer: Self-pay | Admitting: Family Medicine

## 2017-07-22 DIAGNOSIS — Z85828 Personal history of other malignant neoplasm of skin: Secondary | ICD-10-CM | POA: Diagnosis not present

## 2017-07-22 DIAGNOSIS — L812 Freckles: Secondary | ICD-10-CM | POA: Diagnosis not present

## 2017-07-22 DIAGNOSIS — L821 Other seborrheic keratosis: Secondary | ICD-10-CM | POA: Diagnosis not present

## 2017-10-03 ENCOUNTER — Encounter: Payer: Self-pay | Admitting: Family Medicine

## 2017-10-03 ENCOUNTER — Ambulatory Visit: Payer: 59 | Admitting: Family Medicine

## 2017-10-03 VITALS — BP 122/70 | Ht 73.0 in | Wt 230.0 lb

## 2017-10-03 DIAGNOSIS — G620 Drug-induced polyneuropathy: Secondary | ICD-10-CM

## 2017-10-03 DIAGNOSIS — T451X5A Adverse effect of antineoplastic and immunosuppressive drugs, initial encounter: Secondary | ICD-10-CM | POA: Diagnosis not present

## 2017-10-03 MED ORDER — NORTRIPTYLINE HCL 25 MG PO CAPS: 25.0000 mg | ORAL_CAPSULE | Freq: Every day | ORAL | 5 refills | Status: DC

## 2017-10-03 NOTE — Progress Notes (Signed)
Contacted [employer]; providers is speaking with [employer]

## 2017-10-03 NOTE — Progress Notes (Signed)
   Subjective:    Patient ID: John Marquez, male    DOB: Jun 10, 1956, 62 y.o.   MRN: [id number]  HPI Patient states he is here today to discuss neuropathy that is in his feet and fingers. He states has been present for around a year as he had chemo at the time and then developed this. This patient has severe neuropathy of the hands in the feet.  This is been present over the past year since he had chemo.  The patient is completely disabled because of this.  He is unable to work his job.  Unable to walk for any significant length of time unable to climb ladders cannot feel his balance well.  He describes burning and pain and discomfort constantly but worse in the evening  Review of Systems  Constitutional: Negative for activity change, appetite change and fatigue.  HENT: Negative for congestion and rhinorrhea.   Respiratory: Negative for cough, chest tightness and shortness of breath.   Cardiovascular: Negative for chest pain and leg swelling.  Gastrointestinal: Negative for abdominal pain, diarrhea and nausea.  Endocrine: Negative for polydipsia and polyphagia.  Genitourinary: Negative for dysuria and hematuria.  Neurological: Negative for weakness and headaches.  Psychiatric/Behavioral: Negative for confusion and dysphoric mood.       Objective:   Physical Exam  Constitutional: He appears well-nourished. No distress.  HENT:  Head: Normocephalic and atraumatic.  Eyes: Right eye exhibits no discharge. Left eye exhibits no discharge.  Neck: No tracheal deviation present.  Cardiovascular: Normal rate, regular rhythm and normal heart sounds.  No murmur heard. Pulmonary/Chest: Effort normal and breath sounds normal. No respiratory distress. He has no wheezes.  Musculoskeletal: He exhibits no edema.  Lymphadenopathy:    He has no cervical adenopathy.  Neurological: He is alert.  Skin: Skin is warm. No rash noted.  Psychiatric: His behavior is normal.  Vitals reviewed.           Assessment & Plan:  Peripheral neuropathy due to chemotherapy this will never get better this is a permanent disability  He is to continue his psychiatric medicines for his depression and counseling with specialist  Consider nortriptyline to add at nighttime but will need to touch base with his specialist

## 2017-10-05 DIAGNOSIS — C8338 Diffuse large B-cell lymphoma, lymph nodes of multiple sites: Secondary | ICD-10-CM | POA: Diagnosis not present

## 2017-10-18 ENCOUNTER — Other Ambulatory Visit: Payer: Self-pay | Admitting: *Deleted

## 2017-10-18 MED ORDER — NORTRIPTYLINE HCL 25 MG PO CAPS: 25.0000 mg | ORAL_CAPSULE | Freq: Every day | ORAL | 1 refills | Status: DC

## 2018-01-20 DIAGNOSIS — Z85820 Personal history of malignant melanoma of skin: Secondary | ICD-10-CM | POA: Diagnosis not present

## 2018-01-20 DIAGNOSIS — D225 Melanocytic nevi of trunk: Secondary | ICD-10-CM | POA: Diagnosis not present

## 2018-01-20 DIAGNOSIS — Z85828 Personal history of other malignant neoplasm of skin: Secondary | ICD-10-CM | POA: Diagnosis not present

## 2018-03-01 DIAGNOSIS — G620 Drug-induced polyneuropathy: Secondary | ICD-10-CM | POA: Diagnosis not present

## 2018-03-01 DIAGNOSIS — C8338 Diffuse large B-cell lymphoma, lymph nodes of multiple sites: Secondary | ICD-10-CM | POA: Diagnosis not present

## 2018-03-18 ENCOUNTER — Other Ambulatory Visit: Payer: Self-pay | Admitting: Neurology

## 2018-03-20 ENCOUNTER — Other Ambulatory Visit: Payer: Self-pay | Admitting: Neurology

## 2018-03-21 ENCOUNTER — Other Ambulatory Visit: Payer: Self-pay | Admitting: Neurology

## 2018-03-27 DIAGNOSIS — C44622 Squamous cell carcinoma of skin of right upper limb, including shoulder: Secondary | ICD-10-CM | POA: Diagnosis not present

## 2018-03-30 ENCOUNTER — Other Ambulatory Visit: Payer: Self-pay | Admitting: Neurology

## 2018-03-30 ENCOUNTER — Telehealth: Payer: Self-pay | Admitting: Neurology

## 2018-03-30 NOTE — Telephone Encounter (Signed)
Pt called regarding refill forOLANZapine (ZYPREXA) 5 MG tablet(Expired),  pharmacy is telling him they have not heard back from the clinic. I advised him the refill for OLANZapine (ZYPREXA) 5 MG tablet(Expired) had been denied as he needs to be seen. Pt is taking this medication for sleep. Does he need to make an appt? Please call to advise

## 2018-03-30 NOTE — Telephone Encounter (Signed)
Patient has only been seen once in, 02/2017, for polyneuropathy.  Dr. Krista Blue provided a refill at that time as a courtesy.  However, his psychiatrist had been prescribing it for him. She declined to refill today. This is not a medication that she manages.  I spoke to the patient to provide this information.  He will contact his PCP or psychiatrist for his refill.

## 2018-04-03 ENCOUNTER — Telehealth: Payer: Self-pay | Admitting: Family Medicine

## 2018-04-03 NOTE — Telephone Encounter (Signed)
May have this +1 refills If we are going to be the ones managing this then we will need to have him follow-up this fall This is a medication that his psychiatrist can manage

## 2018-04-03 NOTE — Telephone Encounter (Signed)
Left message to return call 

## 2018-04-03 NOTE — Telephone Encounter (Signed)
Dr Rhea Belton office stated patient would have to get further refills from Psych or PCP

## 2018-04-03 NOTE — Telephone Encounter (Signed)
Pt is requesting a refill on OLANZapine (ZYPREXA) 5 MG tablet. Dr. Krista Blue was prescribing the pt this medication. Pt has been out of the medication for a week now and he states he is suppose to be slowly taken off the medication. There is a phone note from Dr.Yans office in pts chart on 03/30/18.

## 2018-04-04 ENCOUNTER — Telehealth: Payer: Self-pay | Admitting: Family Medicine

## 2018-04-04 MED ORDER — OLANZAPINE 5 MG PO TABS: 5.0000 mg | ORAL_TABLET | Freq: Every day | ORAL | 0 refills | Status: DC

## 2018-04-04 NOTE — Telephone Encounter (Signed)
Prescription sent electronically to pharmacy. Patient notified. 

## 2018-04-04 NOTE — Telephone Encounter (Signed)
Patient notified and scheduled follow up office visit with Dr Scott 

## 2018-04-04 NOTE — Telephone Encounter (Signed)
Patient is requesting refill on olanzapine 5 mg called into [facility]

## 2018-04-11 ENCOUNTER — Ambulatory Visit: Payer: 59 | Admitting: Family Medicine

## 2018-04-11 ENCOUNTER — Encounter: Payer: Self-pay | Admitting: Family Medicine

## 2018-04-11 ENCOUNTER — Other Ambulatory Visit: Payer: Self-pay | Admitting: *Deleted

## 2018-04-11 VITALS — BP 118/76 | Ht 73.0 in | Wt 231.0 lb

## 2018-04-11 DIAGNOSIS — N529 Male erectile dysfunction, unspecified: Secondary | ICD-10-CM | POA: Diagnosis not present

## 2018-04-11 DIAGNOSIS — Z23 Encounter for immunization: Secondary | ICD-10-CM | POA: Diagnosis not present

## 2018-04-11 DIAGNOSIS — Z125 Encounter for screening for malignant neoplasm of prostate: Secondary | ICD-10-CM

## 2018-04-11 DIAGNOSIS — F5101 Primary insomnia: Secondary | ICD-10-CM

## 2018-04-11 DIAGNOSIS — Z0000 Encounter for general adult medical examination without abnormal findings: Secondary | ICD-10-CM

## 2018-04-11 DIAGNOSIS — Z79899 Other long term (current) drug therapy: Secondary | ICD-10-CM

## 2018-04-11 DIAGNOSIS — Z13220 Encounter for screening for lipoid disorders: Secondary | ICD-10-CM

## 2018-04-11 MED ORDER — ZOLPIDEM TARTRATE 5 MG PO TABS: 5.0000 mg | ORAL_TABLET | Freq: Every evening | ORAL | 1 refills | Status: DC | PRN

## 2018-04-11 MED ORDER — SILDENAFIL CITRATE 20 MG PO TABS: ORAL_TABLET | ORAL | 3 refills | Status: DC

## 2018-04-11 NOTE — Progress Notes (Signed)
Pt notified and verbalized understanding.

## 2018-04-11 NOTE — Progress Notes (Signed)
Left message with wife to have patient return call. 

## 2018-04-11 NOTE — Progress Notes (Signed)
   Subjective:    Patient ID: John Marquez, male    DOB: Jun 15, 1956, 62 y.o.   MRN: [id number]  HPIpt wants to get zyprexa. Pt states he uses for sleeping.  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  Pt would like sildenafil refilled. States it was prescribed before but he never took it.  Patient does state some difficulty obtaining and maintaining an erection  Flu vaccine today.     Review of Systems  Constitutional: Negative for activity change.  HENT: Negative for congestion and rhinorrhea.   Respiratory: Negative for cough and shortness of breath.   Cardiovascular: Negative for chest pain.  Gastrointestinal: Negative for abdominal pain, diarrhea, nausea and vomiting.  Genitourinary: Negative for dysuria and hematuria.  Neurological: Negative for weakness and headaches.  Psychiatric/Behavioral: Negative for behavioral problems and confusion.       Objective:   Physical Exam Today's visit primarily consultation and discussion greater than 15 minutes spent with patient greater than half in discussion       Assessment & Plan:  Insomnia- after long discussion of various options taper off Zyprexa over the next week Recommend Ambien 5 mg nightly Give [place] update how things are going within 2 weeks Long-term Zyprexa increases her risk of multiple other health issues We did discuss various options She is aware of when he takes Ambien to only take it when he is at home and only when he can set aside at least 8 hours for sleep if he is having any sedation issues with this or other problems he is to notify us he will give  [place] an update within the next 2 weeks  Sildenafil for erectile dysfunction to get filled through [facility] proper way to take it discussed  Wellness exam in January

## 2018-04-26 ENCOUNTER — Other Ambulatory Visit: Payer: Self-pay | Admitting: *Deleted

## 2018-04-26 ENCOUNTER — Other Ambulatory Visit: Payer: Self-pay | Admitting: Family Medicine

## 2018-04-26 ENCOUNTER — Telehealth: Payer: Self-pay | Admitting: Family Medicine

## 2018-04-26 NOTE — Telephone Encounter (Signed)
Nurses On last visit patient was going to taper off of Zyprexa Just [facility] a [facility] Please verify with the patient does he want this refilled Is patient planning on tapering off of the Zyprexa The plan was to put him on Ambien at nighttime  If any questions or problems please let me know If the patient does not want the medication refilled please call the pharmacy and cancel this and take it off his med list

## 2018-04-26 NOTE — Telephone Encounter (Signed)
Left message to return call 

## 2018-04-26 NOTE — Telephone Encounter (Signed)
Pt states he has switched to Ambien and is doing well on it. Medication taken off list. Contacted pharmacy and cancelled this medication and all refills.

## 2018-05-28 ENCOUNTER — Other Ambulatory Visit: Payer: Self-pay | Admitting: Neurology

## 2018-06-09 ENCOUNTER — Telehealth: Payer: Self-pay | Admitting: Family Medicine

## 2018-06-09 ENCOUNTER — Other Ambulatory Visit: Payer: Self-pay | Admitting: Family Medicine

## 2018-06-09 MED ORDER — ZOLPIDEM TARTRATE 10 MG PO TABS: 10.0000 mg | ORAL_TABLET | Freq: Every evening | ORAL | 5 refills | Status: DC | PRN

## 2018-06-09 MED ORDER — DULOXETINE HCL 60 MG PO CPEP: 60.0000 mg | ORAL_CAPSULE | Freq: Every day | ORAL | 1 refills | Status: DC

## 2018-06-09 NOTE — Telephone Encounter (Signed)
Pt has a question about zolpidem (AMBIEN) 5 MG tablet. He doesn't feel it is helping him sleep as well as it should be and is wanting to know if he should increase or try another medication.   Pt is also wanting to know if Dr. Nicki Reaper would refill his DULoxetine (CYMBALTA) 60 MG capsule. hes been out of that medication about a week he was hoping Dr. Nicki Reaper could take over this medication for him instead of him having to follow up with neurologist. [id number] [place], [place]

## 2018-06-09 NOTE — Telephone Encounter (Signed)
Both were sent in as requested-FYI

## 2018-06-09 NOTE — Telephone Encounter (Signed)
Patient is agreeable to all. He says yes he would like for you to send in both medications to [facility].

## 2018-06-09 NOTE — Telephone Encounter (Signed)
It would be fine for [place] to prescribe the Cymbalta 60 mg Cymbalta, #90, 1 refill As for the Ambien at 5 mg is not helping enough we can increase it to 10 mg If the patient agrees to this find out which pharmacy and I will send it in later that today as in this evening after I finished up with all the other patient visits

## 2018-07-04 DIAGNOSIS — Z125 Encounter for screening for malignant neoplasm of prostate: Secondary | ICD-10-CM | POA: Diagnosis not present

## 2018-07-04 DIAGNOSIS — Z79899 Other long term (current) drug therapy: Secondary | ICD-10-CM | POA: Diagnosis not present

## 2018-07-04 DIAGNOSIS — Z0000 Encounter for general adult medical examination without abnormal findings: Secondary | ICD-10-CM | POA: Diagnosis not present

## 2018-07-04 DIAGNOSIS — Z13220 Encounter for screening for lipoid disorders: Secondary | ICD-10-CM | POA: Diagnosis not present

## 2018-07-05 LAB — HEPATIC FUNCTION PANEL
ALT: 35 IU/L (ref 0–44)
AST: 18 IU/L (ref 0–40)
Albumin: 4.4 g/dL (ref 3.6–4.8)
Alkaline Phosphatase: 79 IU/L (ref 39–117)
Bilirubin Total: 0.4 mg/dL (ref 0.0–1.2)
Bilirubin, Direct: 0.14 mg/dL (ref 0.00–0.40)
Total Protein: 6.3 g/dL (ref 6.0–8.5)

## 2018-07-05 LAB — BASIC METABOLIC PANEL
BUN / CREAT RATIO: 15 (ref 10–24)
BUN: 16 mg/dL (ref 8–27)
CHLORIDE: 101 mmol/L (ref 96–106)
CO2: 23 mmol/L (ref 20–29)
Calcium: 9.3 mg/dL (ref 8.6–10.2)
Creatinine, Ser: 1.06 mg/dL (ref 0.76–1.27)
GFR calc non Af Amer: 76 mL/min/{1.73_m2} (ref >59)
GFR, EST AFRICAN AMERICAN: 88 mL/min/{1.73_m2} (ref >59)
Glucose: 99 mg/dL (ref 65–99)
POTASSIUM: 4.6 mmol/L (ref 3.5–5.2)
SODIUM: 138 mmol/L (ref 134–144)

## 2018-07-05 LAB — CBC WITH DIFFERENTIAL/PLATELET
Basophils Absolute: 0 10*3/uL (ref 0.0–0.2)
Basos: 1 %
EOS (ABSOLUTE): 0.2 10*3/uL (ref 0.0–0.4)
Eos: 4 %
Hematocrit: 45 % (ref 37.5–51.0)
Hemoglobin: 15.3 g/dL (ref 13.0–17.7)
IMMATURE GRANULOCYTES: 1 %
Immature Grans (Abs): 0 10*3/uL (ref 0.0–0.1)
LYMPHS ABS: 1.1 10*3/uL (ref 0.7–3.1)
Lymphs: 20 %
MCH: 29.4 pg (ref 26.6–33.0)
MCHC: 34 g/dL (ref 31.5–35.7)
MCV: 87 fL (ref 79–97)
MONOS ABS: 0.6 10*3/uL (ref 0.1–0.9)
Monocytes: 10 %
NEUTROS PCT: 64 %
Neutrophils Absolute: 3.6 10*3/uL (ref 1.4–7.0)
PLATELETS: 297 10*3/uL (ref 150–450)
RBC: 5.2 x10E6/uL (ref 4.14–5.80)
RDW: 12.8 % (ref 12.3–15.4)
WBC: 5.5 10*3/uL (ref 3.4–10.8)

## 2018-07-05 LAB — LIPID PANEL
CHOLESTEROL TOTAL: 165 mg/dL (ref 100–199)
Chol/HDL Ratio: 2.7 ratio (ref 0.0–5.0)
HDL: 61 mg/dL (ref >39)
LDL Calculated: 91 mg/dL (ref 0–99)
Triglycerides: 65 mg/dL (ref 0–149)
VLDL CHOLESTEROL CAL: 13 mg/dL (ref 5–40)

## 2018-07-05 LAB — PSA: Prostate Specific Ag, Serum: 0.8 ng/mL (ref 0.0–4.0)

## 2018-07-18 ENCOUNTER — Encounter: Payer: 59 | Admitting: Family Medicine

## 2018-07-31 ENCOUNTER — Ambulatory Visit (INDEPENDENT_AMBULATORY_CARE_PROVIDER_SITE_OTHER): Payer: 59 | Admitting: Family Medicine

## 2018-07-31 ENCOUNTER — Encounter: Payer: Self-pay | Admitting: Family Medicine

## 2018-07-31 VITALS — BP 134/84 | Ht 73.5 in | Wt 231.0 lb

## 2018-07-31 DIAGNOSIS — Z0000 Encounter for general adult medical examination without abnormal findings: Secondary | ICD-10-CM | POA: Diagnosis not present

## 2018-07-31 DIAGNOSIS — N6320 Unspecified lump in the left breast, unspecified quadrant: Secondary | ICD-10-CM | POA: Diagnosis not present

## 2018-07-31 MED ORDER — ZOSTER VAC RECOMB ADJUVANTED 50 MCG/0.5ML IM SUSR: 0.5000 mL | Freq: Once | INTRAMUSCULAR | 1 refills | Status: AC

## 2018-07-31 NOTE — Progress Notes (Signed)
Subjective:    Patient ID: John Marquez, male    DOB: 11-24-1955, 63 y.o.   MRN: [id number]  HPI The patient comes in today for a wellness visit.  Patient denies being depressed Still has neuropathy in his lower feet which make it difficult for him to walk around and have sure footing Uses Ambien at nighttime to help him sleep  A review of their health history was completed.  A review of medications was also completed.  Any needed refills; ambien,   Eating habits: health conscious  Falls/  MVA accidents in past few months: none  Regular exercise: workout one hour to one and a half hours 5 days per week  Specialist pt sees on regular basis: Dr. Johny Drilling - cancer  Preventative health issues were discussed.   Additional concerns: bump/hard spot in left breast area.  Patient relates area under the left breast area is somewhat enlarged firm denies any pain with it.  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  Has had this problem for the right side and was mammogram done by his oncologist.  Review of Systems  Constitutional: Negative for diaphoresis and fatigue.  HENT: Negative for congestion and rhinorrhea.   Respiratory: Negative for cough and shortness of breath.   Cardiovascular: Negative for chest pain and leg swelling.  Gastrointestinal: Negative for abdominal pain and diarrhea.  Skin: Negative for color change and rash.  Neurological: Negative for dizziness and headaches.  Psychiatric/Behavioral: Negative for behavioral problems and confusion.       Objective:   Physical Exam Vitals signs reviewed.  Constitutional:      General: He is not in acute distress. HENT:     Head: Normocephalic and atraumatic.  Eyes:     General:        Right eye: No discharge.        Left eye: No discharge.  Neck:     Trachea: No tracheal deviation.  Cardiovascular:     Rate and Rhythm: Normal rate and regular rhythm.     Heart sounds: Normal heart sounds. No  murmur.  Pulmonary:     Effort: Pulmonary effort is normal. No respiratory distress.     Breath sounds: Normal breath sounds.  Lymphadenopathy:     Cervical: No cervical adenopathy.  Skin:    General: Skin is warm and dry.  Neurological:     Mental Status: He is alert.     Coordination: Coordination normal.  Psychiatric:        Behavior: Behavior normal.    Blood pressure borderline but acceptable I encourage the patient exercise on a regular basis he is already doing weight lifting I recommend for the patient to increase cardio workouts  On physical exam has distinct area underneath the left nipple that is approximately 1 inch x 2 inch x 1 inch in depth soft yet distinct from the rest of the breast tissue     Assessment & Plan:  Adult wellness-complete.wellness physical was conducted today. Importance of diet and exercise were discussed in detail.  In addition to this a discussion regarding safety was also covered. We also reviewed over immunizations and gave recommendations regarding current immunization needed for age.  In addition to this additional areas were also touched on including: Preventative health exams needed:  Colonoscopy next colonoscopy October 2020  Patient was advised yearly wellness exam   Patient to follow-up in 6 months for recheck on Ambien as well as Cymbalta  Patient  does have persistent neuropathy of the lower feet which make it difficult for him to maintain stability with walking and moving therefore has had to retire from his job on disability-this patient is still completely disabled continue with Cymbalta and Trileptal  Patient has persistent insomnia continue Ambien as well  It is possible that some of this could be gynecomastia but more than likely will need a mammogram on the left breast-we will touch base with his oncology specialist at [facility]

## 2018-07-31 NOTE — Patient Instructions (Addendum)
Results for orders placed or performed in visit on 04/11/18  PSA  Result Value Ref Range   Prostate Specific Ag, Serum 0.8 0.0 - 4.0 ng/mL  CBC with Differential/Platelet  Result Value Ref Range   WBC 5.5 3.4 - 10.8 x10E3/uL   RBC 5.20 4.14 - 5.80 x10E6/uL   Hemoglobin 15.3 13.0 - 17.7 g/dL   Hematocrit 45.0 37.5 - 51.0 %   MCV 87 79 - 97 fL   MCH 29.4 26.6 - 33.0 pg   MCHC 34.0 31.5 - 35.7 g/dL   RDW 12.8 12.3 - 15.4 %   Platelets 297 150 - 450 x10E3/uL   Neutrophils 64 Not Estab. %   Lymphs 20 Not Estab. %   Monocytes 10 Not Estab. %   Eos 4 Not Estab. %   Basos 1 Not Estab. %   Neutrophils Absolute 3.6 1.4 - 7.0 x10E3/uL   Lymphocytes Absolute 1.1 0.7 - 3.1 x10E3/uL   Monocytes Absolute 0.6 0.1 - 0.9 x10E3/uL   EOS (ABSOLUTE) 0.2 0.0 - 0.4 x10E3/uL   Basophils Absolute 0.0 0.0 - 0.2 x10E3/uL   Immature Granulocytes 1 Not Estab. %   Immature Grans (Abs) 0.0 0.0 - 0.1 R15Q0/GQ  Basic metabolic panel  Result Value Ref Range   Glucose 99 65 - 99 mg/dL   BUN 16 8 - 27 mg/dL   Creatinine, Ser 1.06 0.76 - 1.27 mg/dL   GFR calc non Af Amer 76 >59 mL/min/1.73   GFR calc Af Amer 88 >59 mL/min/1.73   BUN/Creatinine Ratio 15 10 - 24   Sodium 138 134 - 144 mmol/L   Potassium 4.6 3.5 - 5.2 mmol/L   Chloride 101 96 - 106 mmol/L   CO2 23 20 - 29 mmol/L   Calcium 9.3 8.6 - 10.2 mg/dL  Hepatic function panel  Result Value Ref Range   Total Protein 6.3 6.0 - 8.5 g/dL   Albumin 4.4 3.6 - 4.8 g/dL   Bilirubin Total 0.4 0.0 - 1.2 mg/dL   Bilirubin, Direct 0.14 0.00 - 0.40 mg/dL   Alkaline Phosphatase 79 39 - 117 IU/L   AST 18 0 - 40 IU/L   ALT 35 0 - 44 IU/L  Lipid panel  Result Value Ref Range   Cholesterol, Total 165 100 - 199 mg/dL   Triglycerides 65 0 - 149 mg/dL   HDL 61 >39 mg/dL   VLDL Cholesterol Cal 13 5 - 40 mg/dL   LDL Calculated 91 0 - 99 mg/dL   Chol/HDL Ratio 2.7 0.0 - 5.0 ratio     Shingrix and shingles prevention: know the facts!   Shingrix is a very  effective vaccine to prevent shingles.   Shingles is a reactivation of chickenpox -more than 99% of Americans born before 1980 have had chickenpox even if they do not remember it. One in every 10 people who get shingles have severe long-lasting nerve pain as a result.   33 out of a 100 older adults will get shingles if they are unvaccinated.     This vaccine is very important for your health This vaccine is indicated for anyone 50 years or older. You can get this vaccine even if you have already had shingles because you can get the disease more than once in a lifetime.  Your risk for shingles and its complications increases with age.  This vaccine has 2 doses.  The second dose would be 2 to 6 months after the first dose.  If you had Zostavax vaccine  in the past you should still get Shingrix. ( Zostavax is only 70% effective and it loses significant strength over a few years .)  This vaccine is given through the pharmacy.  The cost of the vaccine is through your insurance. The pharmacy can inform you of the total costs.  Common side effects including soreness in the arm, some redness and swelling, also some feel fatigue muscle soreness headache low-grade fever.  Side effects typically go away within 2 to 3 days. Remember-the pain from shingles can last a lifetime but these side effects of the vaccine will only last a few days at most. It is very important to get both doses in order to protect yourself fully.   Please get this vaccine at your earliest convenience at your trusted pharmacy.       DASH Eating Plan DASH stands for "Dietary Approaches to Stop Hypertension." The DASH eating plan is a healthy eating plan that has been shown to reduce high blood pressure (hypertension). It may also reduce your risk for type 2 diabetes, heart disease, and stroke. The DASH eating plan may also help with weight loss. What are tips for following this plan?  General guidelines  Avoid eating more  than 2,300 mg (milligrams) of salt (sodium) a day. If you have hypertension, you may need to reduce your sodium intake to 1,500 mg a day.  Limit alcohol intake to no more than 1 drink a day for nonpregnant women and 2 drinks a day for men. One drink equals 12 oz of beer, 5 oz of wine, or 1 oz of hard liquor.  Work with your health care provider to maintain a healthy body weight or to lose weight. Ask what an ideal weight is for you.  Get at least 30 minutes of exercise that causes your heart to beat faster (aerobic exercise) most days of the week. Activities may include walking, swimming, or biking.  Work with your health care provider or diet and nutrition specialist (dietitian) to adjust your eating plan to your individual calorie needs. Reading food labels   Check food labels for the amount of sodium per serving. Choose foods with less than 5 percent of the Daily Value of sodium. Generally, foods with less than 300 mg of sodium per serving fit into this eating plan.  To find whole grains, look for the word "whole" as the first word in the ingredient list. Shopping  Buy products labeled as "low-sodium" or "no salt added."  Buy fresh foods. Avoid canned foods and premade or frozen meals. Cooking  Avoid adding salt when cooking. Use salt-free seasonings or herbs instead of table salt or sea salt. Check with your health care provider or pharmacist before using salt substitutes.  Do not fry foods. Cook foods using healthy methods such as baking, boiling, grilling, and broiling instead.  Cook with heart-healthy oils, such as olive, canola, soybean, or sunflower oil. Meal planning  Eat a balanced diet that includes: ? 5 or more servings of fruits and vegetables each day. At each meal, try to fill half of your plate with fruits and vegetables. ? Up to 6-8 servings of whole grains each day. ? Less than 6 oz of lean meat, poultry, or fish each day. A 3-oz serving of meat is about the same  size as a deck of cards. One egg equals 1 oz. ? 2 servings of low-fat dairy each day. ? A serving of nuts, seeds, or beans 5 times each week. ? Heart-healthy fats.  Healthy fats called Omega-3 fatty acids are found in foods such as flaxseeds and coldwater fish, like sardines, salmon, and mackerel.  Limit how much you eat of the following: ? Canned or prepackaged foods. ? Food that is high in trans fat, such as fried foods. ? Food that is high in saturated fat, such as fatty meat. ? Sweets, desserts, sugary drinks, and other foods with added sugar. ? Full-fat dairy products.  Do not salt foods before eating.  Try to eat at least 2 vegetarian meals each week.  Eat more home-cooked food and less restaurant, buffet, and fast food.  When eating at a restaurant, ask that your food be prepared with less salt or no salt, if possible. What foods are recommended? The items listed may not be a complete list. Talk with your dietitian about what dietary choices are best for you. Grains Whole-grain or whole-wheat bread. Whole-grain or whole-wheat pasta. Brown rice. [place]. Bulgur. Whole-grain and low-sodium cereals. Pita bread. Low-fat, low-sodium crackers. Whole-wheat flour tortillas. Vegetables Fresh or frozen vegetables (raw, steamed, roasted, or grilled). Low-sodium or reduced-sodium tomato and vegetable juice. Low-sodium or reduced-sodium tomato sauce and tomato paste. Low-sodium or reduced-sodium canned vegetables. Fruits All fresh, dried, or frozen fruit. Canned fruit in natural juice (without added sugar). Meat and other protein foods Skinless chicken or [place]. Ground chicken or [place]. Pork with fat trimmed off. Fish and seafood. Egg whites. Dried beans, peas, or lentils. Unsalted nuts, nut butters, and seeds. Unsalted canned beans. Lean cuts of beef with fat trimmed off. Low-sodium, lean deli meat. Dairy Low-fat (1%) or fat-free (skim) milk. Fat-free, low-fat, or reduced-fat  cheeses. Nonfat, low-sodium ricotta or cottage cheese. Low-fat or nonfat yogurt. Low-fat, low-sodium cheese. Fats and oils Soft margarine without trans fats. Vegetable oil. Low-fat, reduced-fat, or light mayonnaise and salad dressings (reduced-sodium). Canola, safflower, olive, soybean, and sunflower oils. Avocado. Seasoning and other foods Herbs. Spices. Seasoning mixes without salt. Unsalted popcorn and pretzels. Fat-free sweets. What foods are not recommended? The items listed may not be a complete list. Talk with your dietitian about what dietary choices are best for you. Grains Baked goods made with fat, such as croissants, muffins, or some breads. Dry pasta or rice meal packs. Vegetables Creamed or fried vegetables. Vegetables in a cheese sauce. Regular canned vegetables (not low-sodium or reduced-sodium). Regular canned tomato sauce and paste (not low-sodium or reduced-sodium). Regular tomato and vegetable juice (not low-sodium or reduced-sodium). Angie Fava. Olives. Fruits Canned fruit in a light or heavy syrup. Fried fruit. Fruit in cream or butter sauce. Meat and other protein foods Fatty cuts of meat. Ribs. Fried meat. Berniece Salines. Sausage. Bologna and other processed lunch meats. Salami. Fatback. Hotdogs. Bratwurst. Salted nuts and seeds. Canned beans with added salt. Canned or smoked fish. Whole eggs or egg yolks. Chicken or [place] with skin. Dairy Whole or 2% milk, cream, and half-and-half. Whole or full-fat cream cheese. Whole-fat or sweetened yogurt. Full-fat cheese. Nondairy creamers. Whipped toppings. Processed cheese and cheese spreads. Fats and oils Butter. Stick margarine. Lard. Shortening. Ghee. Bacon fat. Tropical oils, such as coconut, palm kernel, or palm oil. Seasoning and other foods Salted popcorn and pretzels. Onion salt, garlic salt, seasoned salt, table salt, and sea salt. Worcestershire sauce. Tartar sauce. Barbecue sauce. Teriyaki sauce. Soy sauce, including  reduced-sodium. Steak sauce. Canned and packaged gravies. Fish sauce. Oyster sauce. Cocktail sauce. Horseradish that you find on the shelf. Ketchup. Mustard. Meat flavorings and tenderizers. Bouillon cubes. Hot sauce and Tabasco sauce. Premade or  packaged marinades. Premade or packaged taco seasonings. Relishes. Regular salad dressings. Where to find more information:  National Heart, Lung, and [place]: https://wilson-eaton.com/  American Heart Association: www.heart.org Summary  The DASH eating plan is a healthy eating plan that has been shown to reduce high blood pressure (hypertension). It may also reduce your risk for type 2 diabetes, heart disease, and stroke.  With the DASH eating plan, you should limit salt (sodium) intake to 2,300 mg a day. If you have hypertension, you may need to reduce your sodium intake to 1,500 mg a day.  When on the DASH eating plan, aim to eat more fresh fruits and vegetables, whole grains, lean proteins, low-fat dairy, and heart-healthy fats.  Work with your health care provider or diet and nutrition specialist (dietitian) to adjust your eating plan to your individual calorie needs. This information is not intended to replace advice given to you by your health care provider. Make sure you discuss any questions you have with your health care provider. Document Released: 06/10/2011 Document Revised: 06/14/2016 Document Reviewed: 06/14/2016 Elsevier Interactive Patient Education  2019 [employer].

## 2018-08-04 ENCOUNTER — Telehealth: Payer: Self-pay | Admitting: Family Medicine

## 2018-08-04 MED ORDER — AZITHROMYCIN 250 MG PO TABS: ORAL_TABLET | ORAL | 0 refills | Status: DC

## 2018-08-04 NOTE — Telephone Encounter (Signed)
May send in a Z-Pak Follow-up if any ongoing issues or problems  (Just as likely the patient could have been exposed to germs in numerous places)

## 2018-08-04 NOTE — Progress Notes (Signed)
I spoke with Levada Dy at [facility] she will have someone on the careteam call either cell # or our office #.

## 2018-08-04 NOTE — Telephone Encounter (Signed)
Patient was here Monday for a physical and said he "picked up a bug" while here.  He has a lot of Sinus pressure, and congestion and headache, no fever.  Wanted to see if we would call in a Zpak  [facility] [place]

## 2018-08-04 NOTE — Telephone Encounter (Signed)
Wife aware

## 2018-08-04 NOTE — Telephone Encounter (Signed)
Was seen for a wellness visit by Dr. Nicki Reaper.

## 2018-08-10 ENCOUNTER — Encounter: Payer: Self-pay | Admitting: Family Medicine

## 2018-08-10 NOTE — Progress Notes (Signed)
Called Dr. Lonia Blood office and receptionist states she will page him to call dr scott's cell. Please let nurses know if you do not hear back from Dr. Lonia Blood.

## 2018-08-13 ENCOUNTER — Telehealth: Payer: Self-pay | Admitting: Family Medicine

## 2018-08-13 NOTE — Telephone Encounter (Signed)
Please let the patient know that I did speak with Dr.Dittus his oncology specialist.  We did discuss the left breast mass.  They agree with [place].  They recommend mammogram of left breast.  They stated that this could be done locally.  They recommend follow-up with them at the regular scheduled visit as well.  Please order diagnostic mammogram ultrasound left breast-diagnosis left breast mass and gynecomastia.  Thank you

## 2018-08-14 ENCOUNTER — Other Ambulatory Visit: Payer: Self-pay | Admitting: Family Medicine

## 2018-08-14 DIAGNOSIS — N6320 Unspecified lump in the left breast, unspecified quadrant: Secondary | ICD-10-CM

## 2018-08-14 DIAGNOSIS — N62 Hypertrophy of breast: Secondary | ICD-10-CM

## 2018-08-14 NOTE — Telephone Encounter (Signed)
Spoke with Manuela Schwartz (wife on DPR). Informed wife that provider recommended a diagnostic mammogram ultrasound of left breast. Ultrasound set up for Feb 25 at 1:20 pm at [facility]

## 2018-08-15 ENCOUNTER — Other Ambulatory Visit (HOSPITAL_COMMUNITY): Payer: Self-pay | Admitting: Family Medicine

## 2018-08-15 DIAGNOSIS — N63 Unspecified lump in unspecified breast: Secondary | ICD-10-CM

## 2018-08-29 ENCOUNTER — Ambulatory Visit (HOSPITAL_COMMUNITY)
Admission: RE | Admit: 2018-08-29 | Discharge: 2018-08-29 | Disposition: A | Discharge status: Home / Self Care | Payer: 59 | Source: Ambulatory Visit | Attending: Family Medicine | Admitting: Family Medicine

## 2018-08-29 ENCOUNTER — Ambulatory Visit (HOSPITAL_COMMUNITY): Admission: RE | Admit: 2018-08-29 | Payer: 59 | Source: Ambulatory Visit

## 2018-08-29 ENCOUNTER — Encounter (HOSPITAL_COMMUNITY): Payer: Self-pay

## 2018-08-29 ENCOUNTER — Ambulatory Visit (HOSPITAL_COMMUNITY): Payer: 59

## 2018-08-29 DIAGNOSIS — N62 Hypertrophy of breast: Secondary | ICD-10-CM

## 2018-08-29 DIAGNOSIS — N6320 Unspecified lump in the left breast, unspecified quadrant: Secondary | ICD-10-CM | POA: Diagnosis not present

## 2018-09-19 ENCOUNTER — Other Ambulatory Visit: Payer: Self-pay | Admitting: Family Medicine

## 2018-10-20 ENCOUNTER — Telehealth: Payer: Self-pay | Admitting: Family Medicine

## 2018-10-20 DIAGNOSIS — Z029 Encounter for administrative examinations, unspecified: Secondary | ICD-10-CM

## 2018-10-20 NOTE — Telephone Encounter (Signed)
Patient dropped off disability form to be filled out by physician . I filled in what I could the form is in your box please fill out ,date and sign . Patient needs it back by 11/02/18.

## 2018-10-22 NOTE — Telephone Encounter (Signed)
Please let the patient know that we are willing to fill out his disability statement I need some additional information from the patient to help fill this out the form has changed from last year  If the patient needs to get back with [place] later this week regarding this that is fine He has chemotherapy-induced neuropathy Please find out from the patient if he recalls when he started having symptoms of the neuropathy. Also if he recalls his last date of work-or approximate   In order for me to thoroughly fill out his form to reflect his disability please have the patient tell us what aspects of his job or work he cannot do because of his disability/neuropathy  Once we get this I should be able to have the form filled out within 48 hours  Please get this input from the patient and send it to me

## 2018-10-23 NOTE — Telephone Encounter (Addendum)
Patient states his last day worked was in Sept 2017 and he was diagnosed with the neuropathy in around 08/2016 Patient states he is unable to work because he can not do the long hours of standing and walking and  Can not use his hands for gripping and holding. Patient states he also could not do the climbing on the ladder required for his job.

## 2018-10-23 NOTE — Telephone Encounter (Signed)
Left message to return call 

## 2018-10-27 NOTE — Telephone Encounter (Signed)
Form was completed thank you 

## 2018-12-27 ENCOUNTER — Other Ambulatory Visit: Payer: Self-pay | Admitting: Family Medicine

## 2018-12-28 NOTE — Telephone Encounter (Signed)
May have this +1 refill needs virtual visit this summer

## 2019-01-25 ENCOUNTER — Other Ambulatory Visit: Payer: Self-pay

## 2019-01-29 ENCOUNTER — Encounter: Payer: Self-pay | Admitting: Family Medicine

## 2019-01-29 ENCOUNTER — Ambulatory Visit (INDEPENDENT_AMBULATORY_CARE_PROVIDER_SITE_OTHER): Payer: 59 | Admitting: Family Medicine

## 2019-01-29 ENCOUNTER — Other Ambulatory Visit: Payer: Self-pay

## 2019-01-29 DIAGNOSIS — Z79899 Other long term (current) drug therapy: Secondary | ICD-10-CM | POA: Diagnosis not present

## 2019-01-29 DIAGNOSIS — G620 Drug-induced polyneuropathy: Secondary | ICD-10-CM

## 2019-01-29 DIAGNOSIS — Z0000 Encounter for general adult medical examination without abnormal findings: Secondary | ICD-10-CM

## 2019-01-29 DIAGNOSIS — N529 Male erectile dysfunction, unspecified: Secondary | ICD-10-CM

## 2019-01-29 DIAGNOSIS — Z125 Encounter for screening for malignant neoplasm of prostate: Secondary | ICD-10-CM

## 2019-01-29 DIAGNOSIS — T451X5A Adverse effect of antineoplastic and immunosuppressive drugs, initial encounter: Secondary | ICD-10-CM

## 2019-01-29 DIAGNOSIS — G4700 Insomnia, unspecified: Secondary | ICD-10-CM | POA: Insufficient documentation

## 2019-01-29 MED ORDER — ZOLPIDEM TARTRATE 10 MG PO TABS: ORAL_TABLET | ORAL | 5 refills | Status: DC

## 2019-01-29 MED ORDER — DULOXETINE HCL 60 MG PO CPEP: ORAL_CAPSULE | ORAL | 1 refills | Status: DC

## 2019-01-29 NOTE — Progress Notes (Signed)
   Subjective:    Patient ID: John Marquez, male    DOB: December 14, 1955, 63 y.o.   MRN: [id number]  HPI Pt here for 6 month follow up. Mass around left breast area has not gotten any bigger; doesn't give any problems. Can still feel it but no problems.  Patient overall feels he is doing fairly well Denies any major setbacks Denies any rectal bleeding hematuria He is having some intermittent upper chest discomfort with weight lifting has tried various stretches and adjustment to technique but still having this if it is ongoing patient was told to let us know we will set him up with sports orthopedics  Does have insomnia Patient suffers with insomnia.  This is been going on for a while.  The patient finds it necessary to use medication to help sleep.  Patient finds it if not using medication has significant troubles.  Denies abusing the medication.  Denies any negative side effects.  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  Location: Patient: home Provider: office   I discussed the limitations of evaluation and management by telemedicine and the availability of in person appointments. The patient expressed understanding and agreed to proceed.  History of Present Illness:    Observations/Objective:   Assessment and Plan:   Follow Up Instructions:    I discussed the assessment and treatment plan with the patient. The patient was provided an opportunity to ask questions and all were answered. The patient agreed with the plan and demonstrated an understanding of the instructions.   The patient was advised to call back or seek an  in-person evaluation if the symptoms worsen or if the condition fails to improve as anticipated.  I provided 15 minutes of non-face-to-face time during this encounter.   Vicente Males, LPN     Review of Systems  Constitutional: Negative for diaphoresis and fatigue.  HENT: Negative for congestion and rhinorrhea.   Respiratory: Negative for cough and shortness of breath.   Cardiovascular: Negative for chest pain and leg swelling.  Gastrointestinal: Negative for abdominal pain and diarrhea.  Skin: Negative for color change and rash.  Neurological: Negative for dizziness and headaches.  Psychiatric/Behavioral: Negative for behavioral problems and confusion.       Objective:   Physical Exam  Patient had virtual visit Appears to be in no distress Atraumatic Neuro able to relate and oriented No apparent resp distress Color normal       Assessment & Plan:  Chemotherapy-induced neuropathy recommend the Cymbalta ongoing follow-up here as needed  Wellness checkup in 6 months  Lab work in 5 months  Insomnia continue Ambien prescription sent in drug registry checked

## 2019-02-01 ENCOUNTER — Telehealth: Payer: Self-pay | Admitting: Family Medicine

## 2019-02-01 NOTE — Telephone Encounter (Signed)
Patient had phone visit on Monday. Forgot to tell Dr. Nicki Reaper that he has restless leg at night and is there anything he can take for this?

## 2019-02-12 NOTE — Telephone Encounter (Signed)
So it would be necessary for the patient to fill out questionnaire and send it back to [place] please mail this to the patient

## 2019-02-12 NOTE — Telephone Encounter (Signed)
Mailed out information today

## 2019-02-25 ENCOUNTER — Other Ambulatory Visit: Payer: Self-pay | Admitting: Family Medicine

## 2019-03-26 ENCOUNTER — Other Ambulatory Visit: Payer: Self-pay | Admitting: Neurology

## 2019-04-04 ENCOUNTER — Other Ambulatory Visit: Payer: Self-pay | Admitting: Family Medicine

## 2019-04-08 ENCOUNTER — Telehealth: Payer: Self-pay | Admitting: Family Medicine

## 2019-04-08 NOTE — Telephone Encounter (Signed)
Nurses Please let the patient know that I did receive his questionnaire regarding restless legs. It would be reasonable to try medication for restless legs Typically we start at the lowest dose he would take it each evening if it is not significantly helping within the next 2 weeks he would let us know and we would adjust the dose  Please check with the patient to see if he is interested in trying new medication These medications are generally well-tolerated.  They work on dopamine receptors to help with restless legs Requip is an example

## 2019-04-09 ENCOUNTER — Other Ambulatory Visit: Payer: Self-pay | Admitting: Family Medicine

## 2019-04-09 MED ORDER — ROPINIROLE HCL 0.5 MG PO TABS: 0.5000 mg | ORAL_TABLET | Freq: Every day | ORAL | 3 refills | Status: DC

## 2019-04-09 NOTE — Telephone Encounter (Signed)
Patient is interested in trying medication and would like it called into [facility] in [place] . Patient will check with pharmacy after 5pm and will call back in 2 weeks with an update so the doctor can see if dose needs to be adjusted.

## 2019-04-09 NOTE — Telephone Encounter (Signed)
Left message to return call 

## 2019-04-09 NOTE — Telephone Encounter (Signed)
Prescription was sent in per request Requip 0.5 mg nightly-FYI

## 2019-05-07 ENCOUNTER — Telehealth: Payer: Self-pay | Admitting: Family Medicine

## 2019-05-07 NOTE — Telephone Encounter (Signed)
Contacted patient, spoke with wife. Contacted due to provider wanting to make sure pt was following up with dermatology. Wife states that patient went about 2-3 weeks ago. Pt does have a follow up schedule. Pt is seeing Dr.Drew Ronnald Ramp at [facility]. [place] form placed in scan file to be scanned.

## 2019-07-08 ENCOUNTER — Other Ambulatory Visit: Payer: Self-pay | Admitting: Family Medicine

## 2019-07-09 NOTE — Telephone Encounter (Signed)
Please schedule and then route back to nurses to send in refills 

## 2019-07-09 NOTE — Telephone Encounter (Signed)
May have 1 refill on each needs a virtual follow-up visit in the first 3 months of the year

## 2019-07-09 NOTE — Telephone Encounter (Signed)
Scheduled 2/16

## 2019-07-12 ENCOUNTER — Other Ambulatory Visit: Payer: Self-pay | Admitting: Family Medicine

## 2019-07-12 NOTE — Telephone Encounter (Signed)
Please refill this with 1 additional refill may pend it then I will sign it thank you

## 2019-07-12 NOTE — Telephone Encounter (Signed)
Last med check jan 2020

## 2019-07-12 NOTE — Telephone Encounter (Signed)
Med pended

## 2019-08-19 ENCOUNTER — Other Ambulatory Visit: Payer: Self-pay | Admitting: Neurology

## 2019-08-21 ENCOUNTER — Ambulatory Visit (INDEPENDENT_AMBULATORY_CARE_PROVIDER_SITE_OTHER): Payer: 59 | Admitting: Family Medicine

## 2019-08-21 ENCOUNTER — Other Ambulatory Visit: Payer: Self-pay

## 2019-08-21 ENCOUNTER — Encounter: Payer: Self-pay | Admitting: Family Medicine

## 2019-08-21 DIAGNOSIS — G620 Drug-induced polyneuropathy: Secondary | ICD-10-CM

## 2019-08-21 DIAGNOSIS — G2581 Restless legs syndrome: Secondary | ICD-10-CM | POA: Diagnosis not present

## 2019-08-21 DIAGNOSIS — G4700 Insomnia, unspecified: Secondary | ICD-10-CM

## 2019-08-21 DIAGNOSIS — Z1159 Encounter for screening for other viral diseases: Secondary | ICD-10-CM

## 2019-08-21 DIAGNOSIS — Z79899 Other long term (current) drug therapy: Secondary | ICD-10-CM

## 2019-08-21 DIAGNOSIS — Z114 Encounter for screening for human immunodeficiency virus [HIV]: Secondary | ICD-10-CM

## 2019-08-21 DIAGNOSIS — Z125 Encounter for screening for malignant neoplasm of prostate: Secondary | ICD-10-CM

## 2019-08-21 DIAGNOSIS — Z13220 Encounter for screening for lipoid disorders: Secondary | ICD-10-CM

## 2019-08-21 DIAGNOSIS — R5383 Other fatigue: Secondary | ICD-10-CM

## 2019-08-21 HISTORY — DX: Restless legs syndrome: G25.81

## 2019-08-21 MED ORDER — ZOLPIDEM TARTRATE 10 MG PO TABS: ORAL_TABLET | ORAL | 5 refills | Status: DC

## 2019-08-21 MED ORDER — SILDENAFIL CITRATE 20 MG PO TABS: ORAL_TABLET | ORAL | 3 refills | Status: DC

## 2019-08-21 MED ORDER — DULOXETINE HCL 60 MG PO CPEP: 60.0000 mg | ORAL_CAPSULE | Freq: Every day | ORAL | 1 refills | Status: DC

## 2019-08-21 MED ORDER — ROPINIROLE HCL 0.5 MG PO TABS: 0.5000 mg | ORAL_TABLET | Freq: Every day | ORAL | 1 refills | Status: DC

## 2019-08-21 NOTE — Progress Notes (Signed)
   Subjective:    Patient ID: John Marquez, male    DOB: [birth year], 64 y.o.   MRN: [id number]  HPI Virtual visit Video Patient relates his neuropathy get little bit worse worse in the hands worsen if he has a hard time sensing his feet no falls no injuries states his moods overall been doing well compliance with medicine good trying to eat healthy stay physically active patient also having some increased numbness into his hands makes buttoning shirts a little more difficult but him otherwise he seems to be tolerating everything okay he denies any major setbacks. Patient calls for a follow up on insomnia and neuropathy. Patient is doing well but feels his neuropathy is getting worse.  Virtual Visit via Video Note  I connected with John Marquez on 08/21/19 at  8:30 AM EST by a video enabled telemedicine application and verified that I am speaking with the correct person using two identifiers.  Location: Patient: home Provider: office   I discussed the limitations of evaluation and management by telemedicine and the availability of in person appointments. The patient expressed understanding and agreed to proceed.  History of Present Illness:    Observations/Objective:   Assessment and Plan:   Follow Up Instructions:    I discussed the assessment and treatment plan with the patient. The patient was provided an opportunity to ask questions and all were answered. The patient agreed with the plan and demonstrated an understanding of the instructions.   The patient was advised to call back or seek an in-person evaluation if the symptoms worsen or if the condition fails to improve as anticipated.  I provided 20 minutes of non-face-to-face time during this encounter.     Review of Systems  Constitutional: Negative for activity change.  HENT: Negative for congestion and rhinorrhea.   Respiratory: Negative for cough and shortness of breath.   Cardiovascular: Negative for chest pain.    Gastrointestinal: Negative for abdominal pain, diarrhea, nausea and vomiting.  Genitourinary: Negative for dysuria and hematuria.  Neurological: Negative for weakness and headaches.  Psychiatric/Behavioral: Negative for behavioral problems and confusion.   Patient does have restless legs that bothers him more in the evening time    Objective:   Physical Exam Patient had virtual visit Appears to be in no distress Atraumatic Neuro able to relate and oriented No apparent resp distress Color normal        Assessment & Plan:  1. Chemotherapy-induced neuropathy ([place]) Patient with significant chemotherapy-induced neuropathy in this situation we would like to check a B12 level and thyroid function but more likely this is just something that is getting remain the same as he gets older or possibly get worse he is disabled from this  2. Insomnia, unspecified type He uses Ambien at nighttime to help him with rest refills were given he states his moods are doing well  3. Restless legs syndrome (RLS) Restless leg syndrome he has trouble with these issues in the late evening hours he will try taking his medication at suppertime and see if this helps if not we may need to adjust the dose  He has a wellness visit coming up next week labs were ordered  Patient will give thought regarding colonoscopy next week when he does his wellness we will get the ball rolling on the referral

## 2019-08-23 LAB — VITAMIN B12: Vitamin B-12: 373 pg/mL (ref 232–1245)

## 2019-08-23 LAB — LIPID PANEL
Chol/HDL Ratio: 2.8 ratio (ref 0.0–5.0)
Cholesterol, Total: 157 mg/dL (ref 100–199)
HDL: 56 mg/dL (ref >39)
LDL Chol Calc (NIH): 82 mg/dL (ref 0–99)
Triglycerides: 107 mg/dL (ref 0–149)
VLDL Cholesterol Cal: 19 mg/dL (ref 5–40)

## 2019-08-23 LAB — PSA: Prostate Specific Ag, Serum: 0.8 ng/mL (ref 0.0–4.0)

## 2019-08-23 LAB — HEPATITIS C ANTIBODY: Hep C Virus Ab: <0.1 s/co ratio (ref 0.0–0.9)

## 2019-08-23 LAB — TSH: TSH: 2.51 u[IU]/mL (ref 0.450–4.500)

## 2019-08-23 LAB — HIV ANTIBODY (ROUTINE TESTING W REFLEX): HIV Screen 4th Generation wRfx: NONREACTIVE

## 2019-08-23 LAB — T4, FREE: Free T4: 0.97 ng/dL (ref 0.82–1.77)

## 2019-08-29 ENCOUNTER — Other Ambulatory Visit: Payer: Self-pay

## 2019-08-29 ENCOUNTER — Encounter: Payer: Self-pay | Admitting: Family Medicine

## 2019-08-29 ENCOUNTER — Ambulatory Visit (INDEPENDENT_AMBULATORY_CARE_PROVIDER_SITE_OTHER): Payer: 59 | Admitting: Family Medicine

## 2019-08-29 VITALS — BP 132/84 | Temp 98.2°F | Ht 73.5 in | Wt 230.8 lb

## 2019-08-29 DIAGNOSIS — Z0000 Encounter for general adult medical examination without abnormal findings: Secondary | ICD-10-CM | POA: Diagnosis not present

## 2019-08-29 DIAGNOSIS — M25541 Pain in joints of right hand: Secondary | ICD-10-CM

## 2019-08-29 DIAGNOSIS — M25542 Pain in joints of left hand: Secondary | ICD-10-CM | POA: Diagnosis not present

## 2019-08-29 DIAGNOSIS — M25562 Pain in left knee: Secondary | ICD-10-CM

## 2019-08-29 NOTE — Progress Notes (Signed)
Added to reminder file for June 2021
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
## 2019-08-29 NOTE — Patient Instructions (Signed)
DASH Eating Plan DASH stands for "Dietary Approaches to Stop Hypertension." The DASH eating plan is a healthy eating plan that has been shown to reduce high blood pressure (hypertension). It may also reduce your risk for type 2 diabetes, heart disease, and stroke. The DASH eating plan may also help with weight loss. What are tips for following this plan?  General guidelines  Avoid eating more than 2,300 mg (milligrams) of salt (sodium) a day. If you have hypertension, you may need to reduce your sodium intake to 1,500 mg a day.  Limit alcohol intake to no more than 1 drink a day for nonpregnant women and 2 drinks a day for men. One drink equals 12 oz of beer, 5 oz of wine, or 1 oz of hard liquor.  Work with your health care provider to maintain a healthy body weight or to lose weight. Ask what an ideal weight is for you.  Get at least 30 minutes of exercise that causes your heart to beat faster (aerobic exercise) most days of the week. Activities may include walking, swimming, or biking.  Work with your health care provider or diet and nutrition specialist (dietitian) to adjust your eating plan to your individual calorie needs. Reading food labels   Check food labels for the amount of sodium per serving. Choose foods with less than 5 percent of the Daily Value of sodium. Generally, foods with less than 300 mg of sodium per serving fit into this eating plan.  To find whole grains, look for the word "whole" as the first word in the ingredient list. Shopping  Buy products labeled as "low-sodium" or "no salt added."  Buy fresh foods. Avoid canned foods and premade or frozen meals. Cooking  Avoid adding salt when cooking. Use salt-free seasonings or herbs instead of table salt or sea salt. Check with your health care provider or pharmacist before using salt substitutes.  Do not fry foods. Cook foods using healthy methods such as baking, boiling, grilling, and broiling instead.  Cook with  heart-healthy oils, such as olive, canola, soybean, or sunflower oil. Meal planning  Eat a balanced diet that includes: ? 5 or more servings of fruits and vegetables each day. At each meal, try to fill half of your plate with fruits and vegetables. ? Up to 6-8 servings of whole grains each day. ? Less than 6 oz of lean meat, poultry, or fish each day. A 3-oz serving of meat is about the same size as a deck of cards. One egg equals 1 oz. ? 2 servings of low-fat dairy each day. ? A serving of nuts, seeds, or beans 5 times each week. ? Heart-healthy fats. Healthy fats called Omega-3 fatty acids are found in foods such as flaxseeds and coldwater fish, like sardines, salmon, and mackerel.  Limit how much you eat of the following: ? Canned or prepackaged foods. ? Food that is high in trans fat, such as fried foods. ? Food that is high in saturated fat, such as fatty meat. ? Sweets, desserts, sugary drinks, and other foods with added sugar. ? Full-fat dairy products.  Do not salt foods before eating.  Try to eat at least 2 vegetarian meals each week.  Eat more home-cooked food and less restaurant, buffet, and fast food.  When eating at a restaurant, ask that your food be prepared with less salt or no salt, if possible. What foods are recommended? The items listed may not be a complete list. Talk with your dietitian about   what dietary choices are best for you. Grains Whole-grain or whole-wheat bread. Whole-grain or whole-wheat pasta. Brown rice. Oatmeal. Quinoa. Bulgur. Whole-grain and low-sodium cereals. Pita bread. Low-fat, low-sodium crackers. Whole-wheat flour tortillas. Vegetables Fresh or frozen vegetables (raw, steamed, roasted, or grilled). Low-sodium or reduced-sodium tomato and vegetable juice. Low-sodium or reduced-sodium tomato sauce and tomato paste. Low-sodium or reduced-sodium canned vegetables. Fruits All fresh, dried, or frozen fruit. Canned fruit in natural juice (without  added sugar). Meat and other protein foods Skinless chicken or turkey. Ground chicken or turkey. Pork with fat trimmed off. Fish and seafood. Egg whites. Dried beans, peas, or lentils. Unsalted nuts, nut butters, and seeds. Unsalted canned beans. Lean cuts of beef with fat trimmed off. Low-sodium, lean deli meat. Dairy Low-fat (1%) or fat-free (skim) milk. Fat-free, low-fat, or reduced-fat cheeses. Nonfat, low-sodium ricotta or cottage cheese. Low-fat or nonfat yogurt. Low-fat, low-sodium cheese. Fats and oils Soft margarine without trans fats. Vegetable oil. Low-fat, reduced-fat, or light mayonnaise and salad dressings (reduced-sodium). Canola, safflower, olive, soybean, and sunflower oils. Avocado. Seasoning and other foods Herbs. Spices. Seasoning mixes without salt. Unsalted popcorn and pretzels. Fat-free sweets. What foods are not recommended? The items listed may not be a complete list. Talk with your dietitian about what dietary choices are best for you. Grains Baked goods made with fat, such as croissants, muffins, or some breads. Dry pasta or rice meal packs. Vegetables Creamed or fried vegetables. Vegetables in a cheese sauce. Regular canned vegetables (not low-sodium or reduced-sodium). Regular canned tomato sauce and paste (not low-sodium or reduced-sodium). Regular tomato and vegetable juice (not low-sodium or reduced-sodium). Pickles. Olives. Fruits Canned fruit in a light or heavy syrup. Fried fruit. Fruit in cream or butter sauce. Meat and other protein foods Fatty cuts of meat. Ribs. Fried meat. Bacon. Sausage. Bologna and other processed lunch meats. Salami. Fatback. Hotdogs. Bratwurst. Salted nuts and seeds. Canned beans with added salt. Canned or smoked fish. Whole eggs or egg yolks. Chicken or turkey with skin. Dairy Whole or 2% milk, cream, and half-and-half. Whole or full-fat cream cheese. Whole-fat or sweetened yogurt. Full-fat cheese. Nondairy creamers. Whipped toppings.  Processed cheese and cheese spreads. Fats and oils Butter. Stick margarine. Lard. Shortening. Ghee. Bacon fat. Tropical oils, such as coconut, palm kernel, or palm oil. Seasoning and other foods Salted popcorn and pretzels. Onion salt, garlic salt, seasoned salt, table salt, and sea salt. Worcestershire sauce. Tartar sauce. Barbecue sauce. Teriyaki sauce. Soy sauce, including reduced-sodium. Steak sauce. Canned and packaged gravies. Fish sauce. Oyster sauce. Cocktail sauce. Horseradish that you find on the shelf. Ketchup. Mustard. Meat flavorings and tenderizers. Bouillon cubes. Hot sauce and Tabasco sauce. Premade or packaged marinades. Premade or packaged taco seasonings. Relishes. Regular salad dressings. Where to find more information:  National Heart, Lung, and Blood Institute: www.nhlbi.nih.gov  American Heart Association: www.heart.org Summary  The DASH eating plan is a healthy eating plan that has been shown to reduce high blood pressure (hypertension). It may also reduce your risk for type 2 diabetes, heart disease, and stroke.  With the DASH eating plan, you should limit salt (sodium) intake to 2,300 mg a day. If you have hypertension, you may need to reduce your sodium intake to 1,500 mg a day.  When on the DASH eating plan, aim to eat more fresh fruits and vegetables, whole grains, lean proteins, low-fat dairy, and heart-healthy fats.  Work with your health care provider or diet and nutrition specialist (dietitian) to adjust your eating plan to your   individual calorie needs. This information is not intended to replace advice given to you by your health care provider. Make sure you discuss any questions you have with your health care provider. Document Revised: 06/03/2017 Document Reviewed: 06/14/2016 Elsevier Patient Education  2020 Elsevier Inc.  

## 2019-09-15 ENCOUNTER — Other Ambulatory Visit: Payer: Self-pay | Admitting: Family Medicine

## 2019-10-12 ENCOUNTER — Telehealth: Payer: Self-pay | Admitting: Family Medicine

## 2019-10-12 NOTE — Telephone Encounter (Signed)
Patient dropped off disability form  To be completed he was last seen 08/29/19.After reviewing form will patient need follow up to complete form.Form is in your folder to review.

## 2019-10-14 NOTE — Telephone Encounter (Signed)
So I reviewed over this form  We truly need to do a office visit this can be either in person or virtual with video to review the form in detail with the patient and review his current level of disability so that the form is accurate and he can continue his disability   Please keep up with the form We should be able to accommodate a visit for this purpose within the next 2 weeks either in person or virtual

## 2019-10-18 ENCOUNTER — Other Ambulatory Visit: Payer: Self-pay | Admitting: Neurology

## 2019-10-18 DIAGNOSIS — Z029 Encounter for administrative examinations, unspecified: Secondary | ICD-10-CM

## 2019-10-19 ENCOUNTER — Encounter: Payer: Self-pay | Admitting: Family Medicine

## 2019-10-19 ENCOUNTER — Other Ambulatory Visit: Payer: Self-pay

## 2019-10-19 ENCOUNTER — Telehealth: Payer: Self-pay | Admitting: *Deleted

## 2019-10-19 ENCOUNTER — Telehealth (INDEPENDENT_AMBULATORY_CARE_PROVIDER_SITE_OTHER): Payer: 59 | Admitting: Family Medicine

## 2019-10-19 VITALS — BP 128/80 | Ht 73.5 in | Wt 228.0 lb

## 2019-10-19 DIAGNOSIS — G8929 Other chronic pain: Secondary | ICD-10-CM

## 2019-10-19 DIAGNOSIS — M25562 Pain in left knee: Secondary | ICD-10-CM

## 2019-10-19 DIAGNOSIS — G620 Drug-induced polyneuropathy: Secondary | ICD-10-CM | POA: Diagnosis not present

## 2019-10-19 DIAGNOSIS — T451X5A Adverse effect of antineoplastic and immunosuppressive drugs, initial encounter: Secondary | ICD-10-CM

## 2019-10-19 NOTE — Telephone Encounter (Signed)
[name], morocho are scheduled for a virtual visit with your provider today.    Just as we do with appointments in the office, we must obtain your consent to participate.  Your consent will be active for this visit and any virtual visit you may have with one of our providers in the next 365 days.    If you have a MyChart account, I can also send a copy of this consent to you electronically.  All virtual visits are billed to your insurance company just like a traditional visit in the office.  As this is a virtual visit, video technology does not allow for your provider to perform a traditional examination.  This may limit your provider's ability to fully assess your condition.  If your provider identifies any concerns that need to be evaluated in person or the need to arrange testing such as labs, EKG, etc, we will make arrangements to do so.    Although advances in technology are sophisticated, we cannot ensure that it will always work on either your end or our end.  If the connection with a video visit is poor, we may have to switch to a telephone visit.  With either a video or telephone visit, we are not always able to ensure that we have a secure connection.   I need to obtain your verbal consent now.   Are you willing to proceed with your visit today?   John Marquez has provided verbal consent on 10/19/2019 for a virtual visit (video or telephone).   Patsy Lager, LPN [id number]  [id number]

## 2019-10-19 NOTE — Progress Notes (Signed)
Subjective:    Patient ID: John Marquez, male    DOB: [birth year], 64 y.o.   MRN: [id number]  HPI Patient calls in today to discuss his long-term disability forms.  This patient has neuropathy in both his feet and his hands this was a product of chemotherapy and treatment for his cancer It is been progressive He has dense neuropathy in his lower feet cannot feel his feet makes it very difficult for him to walk and when he goes on uneven surfaces he has had an increased risk of falling he has been advised in the past not to negotiate steps or ladders He had to retire from his work due to disability several years ago and he remains disabled He also has discomfort in the lower legs that is made worse with continue with standing so often he has to sit to get relief he cannot do any squatting pushing going up inclines crawling. He also has neuropathy in his hands with numbness and tingling and intermittent weakness and difficulty gripping twisting pulling and lifting and he has difficult time holding instruments and tools  Virtual Visit via Video Note  I connected with John Marquez on 10/19/19 at 10:00 AM EDT by a video enabled telemedicine application and verified that I am speaking with the correct person using two identifiers.  Location: Patient: home Provider: office   I discussed the limitations of evaluation and management by telemedicine and the availability of in person appointments. The patient expressed understanding and agreed to proceed.  History of Present Illness:    Observations/Objective:   Assessment and Plan:   Follow Up Instructions:    I discussed the assessment and treatment plan with the patient. The patient was provided an opportunity to ask questions and all were answered. The patient agreed with the plan and demonstrated an understanding of the instructions.   The patient was advised to call back or seek an in-person evaluation if the symptoms worsen or if  the condition fails to improve as anticipated. 22 I provided 22 minutes of non-face-to-face time during this encounter.     Review of Systems  Constitutional: Negative for activity change, fatigue and fever.  HENT: Negative for congestion and rhinorrhea.   Respiratory: Negative for cough and shortness of breath.   Cardiovascular: Negative for chest pain and leg swelling.  Gastrointestinal: Negative for abdominal pain, diarrhea and nausea.  Genitourinary: Negative for dysuria and hematuria.  Neurological: Negative for weakness and headaches.  Psychiatric/Behavioral: Negative for agitation and behavioral problems.  Also with numbness and tingling.     Objective:   Physical Exam  Patient had virtual visit Appears to be in no distress Atraumatic Neuro able to relate and oriented No apparent resp distress Color normal       Assessment & Plan:  1. Chronic pain of left knee Left knee pain despite normal for several months will refer to [facility] for further evaluation and treatment  2. Chemotherapy-induced neuropathy ([place]) Patient is disabled permanently Has severe neuropathy in the feet as well as his hands This limits how long he can stand Also creates instability when he walks goes up and down steps or any change in surfaces.  He has been advised not to go up and down ladders He also has neuropathy in his hands which make it difficult to grip twist pull is very difficult for him to manipulate fine small items.  As a result of this he has difficulty with doing tasks around the  house as well as dropping tools and not able to carry things the way he used to  This patient worked for years with [employer] unable to do this work any longer. Also with a level of difficulty he has with the neuropathy he is completely and permanently disabled from any work this is a permanent issue

## 2019-10-24 ENCOUNTER — Telehealth: Payer: Self-pay | Admitting: Family Medicine

## 2019-10-24 NOTE — Telephone Encounter (Signed)
Disability form

## 2019-10-25 ENCOUNTER — Encounter: Payer: Self-pay | Admitting: Family Medicine

## 2019-10-26 ENCOUNTER — Encounter: Payer: Self-pay | Admitting: Family Medicine

## 2019-10-26 NOTE — Telephone Encounter (Signed)
John Marquez  I have reviewed over this patient's records.  He has a large amount of records. I have filled out his disability statement as best as I can. This gentleman has been out of work since he has had lymphoma and then had severe neuropathy Please let the patient know we are doing the best to provide accurate information and we highly support his disability This form does ask when he stopped working I am unable to locate that update within our records (it could be buried within the voluminous amount of electronic records) Potentially the patient would be able to give an estimate that could be put into this record. I will dictate a letter that goes along with this form thank you

## 2019-11-02 ENCOUNTER — Other Ambulatory Visit: Payer: Self-pay | Admitting: *Deleted

## 2019-11-02 DIAGNOSIS — Z1211 Encounter for screening for malignant neoplasm of colon: Secondary | ICD-10-CM

## 2019-11-13 ENCOUNTER — Encounter: Payer: Self-pay | Admitting: Family Medicine

## 2019-11-16 ENCOUNTER — Encounter: Payer: Self-pay | Admitting: Internal Medicine

## 2019-12-27 ENCOUNTER — Ambulatory Visit: Payer: 59 | Admitting: Gastroenterology

## 2019-12-27 ENCOUNTER — Encounter: Payer: Self-pay | Admitting: Gastroenterology

## 2020-01-14 ENCOUNTER — Encounter: Payer: Self-pay | Admitting: Internal Medicine

## 2020-03-07 ENCOUNTER — Other Ambulatory Visit: Payer: Self-pay | Admitting: Family Medicine

## 2020-03-13 ENCOUNTER — Ambulatory Visit: Payer: 59 | Admitting: Gastroenterology

## 2020-04-08 ENCOUNTER — Other Ambulatory Visit: Payer: Self-pay | Admitting: Family Medicine

## 2020-04-09 NOTE — Telephone Encounter (Signed)
Left message to schedule follow up

## 2020-04-11 ENCOUNTER — Telehealth: Payer: Self-pay

## 2020-04-11 MED ORDER — DOXYCYCLINE HYCLATE 100 MG PO TABS: 100.0000 mg | ORAL_TABLET | Freq: Two times a day (BID) | ORAL | 0 refills | Status: DC

## 2020-04-11 NOTE — Telephone Encounter (Signed)
Tried to call, no answer

## 2020-04-11 NOTE — Telephone Encounter (Signed)
Patient wife notified of Dr. Bary Leriche recommendation. Abx sent to pharmacy and wife aware of importance of covid test.

## 2020-04-11 NOTE — Telephone Encounter (Signed)
Patient is returning call to doctor not sure why but spouse told him the provider call a week ago [place] and also wanting something called in for sinus infection. [phone]

## 2020-04-11 NOTE — Telephone Encounter (Signed)
Doxycycline 100 mg 1 twice daily for 7 days Patient is at higher risk regarding Covid Even if he has had a vaccine he should make sure he does a Covid test If positive very important to let us know because we would want to help get him in with infusion center

## 2020-04-11 NOTE — Telephone Encounter (Signed)
Pt contacted. Office tried to call pt to have him set up for medication follow up. Pt also have cough, sore throat and can hardly talk. Pt is currently at [facility] and would like something sent in. Pt transferred up front to schedule appt for follow up. Please advise. Thank you  [place], [place]

## 2020-04-14 NOTE — Telephone Encounter (Signed)
Scheduled appointment 11/2

## 2020-04-22 ENCOUNTER — Ambulatory Visit (INDEPENDENT_AMBULATORY_CARE_PROVIDER_SITE_OTHER): Payer: 59 | Admitting: Gastroenterology

## 2020-04-22 ENCOUNTER — Other Ambulatory Visit: Payer: Self-pay

## 2020-04-22 ENCOUNTER — Encounter: Payer: Self-pay | Admitting: Gastroenterology

## 2020-04-22 DIAGNOSIS — Z1211 Encounter for screening for malignant neoplasm of colon: Secondary | ICD-10-CM | POA: Diagnosis not present

## 2020-04-22 NOTE — H&P (View-Only) (Signed)
Primary Care Physician:  Kathyrn Drown, MD  Referring Physician: Dr. Wolfgang Phoenix  Primary Gastroenterologist:  Dr. Gala Romney   Chief Complaint  Patient presents with  . Colonoscopy    HPI:   John Marquez is a 64 y.o. male presenting today at the request of Dr. Wolfgang Phoenix for screening colonoscopy. Last colonoscopy in 2010 normal. No first-degree relatives with history of colorectal cancer or polyps. He does note his paternal grandfather had colon cancer.   No abdominal pain, N/V, changes in bowel habits, overt GI bleeding, GERD exacerbations, dysphagia, unexplained weight loss, lack of appetite. No concerns today.   Past Medical History:  Diagnosis Date  . Allergic rhinitis   . Cancer ([place])   . 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 Eczema    Winter  . History of melanoma excision    07-23-2015 and 07-31-2015  mid upper back  . Hydronephrosis, right   . Restless legs syndrome (RLS) 08/21/2019  . Retroperitoneal lymphadenopathy     Past Surgical History:  Procedure Laterality Date  . COLONOSCOPY  2010   normal  . CYSTOSCOPY WITH STENT PLACEMENT Right 04/02/2016   Procedure: CYSTOSCOPY WITH RIGHT RETROGRADE PYELOGRAM AND STENT PLACEMENT;  Surgeon: Bjorn Loser, MD;  Location: [facility];  Service: Urology;  Laterality: Right;    Current Outpatient Medications  Medication Sig Dispense Refill  . cetirizine (ZYRTEC) 10 MG chewable tablet Chew 10 mg by mouth as needed for allergies.    . DULoxetine (CYMBALTA) 60 MG capsule TAKE 1 CAPSULE BY MOUTH EVERY DAY 90 capsule 0  . sildenafil (REVATIO) 20 MG tablet 3 to 5 before relations (Patient taking differently: as needed. 3 to 5 before relations) 50 tablet 3  . zolpidem (AMBIEN) 10 MG tablet TAKE 1 TABLET BY MOUTH EVERY DAY AT BEDTIME AS NEEDED 30 tablet 1   No current facility-administered medications for this visit.    Allergies as of 04/22/2020 - Review  Complete 04/22/2020  Allergen Reaction Noted  . Penicillins  11/08/2012  . Levaquin [levofloxacin]  04/01/2016    Family History  Problem Relation Age of Onset  . Hypertension Father   . Cancer Father        lung  . Dementia Mother   . Diabetes Mother   . Hypertension Brother     Social History   Socioeconomic History  . Marital status: Married    Spouse name: Not on file  . Number of children: Not on file  . Years of education: Not on file  . Highest education level: Not on file  Occupational History  . Not on file  Tobacco Use  . Smoking status: Never Smoker  . Smokeless tobacco: Never Used  Substance and Sexual Activity  . Alcohol use: Yes    Alcohol/week: 0.0 standard drinks    Comment: 2 times a week  . Drug use: No  . Sexual activity: Yes  Other Topics Concern  . Not on file  Social History Narrative  . Not on file   Social Determinants of Health   Financial Resource Strain:   . Difficulty of Paying Living Expenses: Not on file  Food Insecurity:   . Worried About Charity fundraiser in the Last Year: Not on file  . Ran Out of Food in the Last Year: Not on file  Transportation Needs:   . Lack of Transportation (Medical): Not on file  .  Lack of Transportation (Non-Medical): Not on file  Physical Activity:   . Days of Exercise per Week: Not on file  . Minutes of Exercise per Session: Not on file  Stress:   . Feeling of Stress : Not on file  Social Connections:   . Frequency of Communication with Friends and Family: Not on file  . Frequency of Social Gatherings with Friends and Family: Not on file  . Attends Religious Services: Not on file  . Active Member of Clubs or Organizations: Not on file  . Attends Archivist Meetings: Not on file  . Marital Status: Not on file  Intimate Partner Violence:   . Fear of Current or Ex-Partner: Not on file  . Emotionally Abused: Not on file  . Physically Abused: Not on file  . Sexually Abused: Not on  file    Review of Systems: Gen: Denies any fever, chills, fatigue, weight loss, lack of appetite.  CV: Denies chest pain, heart palpitations, peripheral edema, syncope.  Resp: Denies shortness of breath at rest or with exertion. Denies wheezing or cough.  GI: see HPI GU : Denies urinary burning, urinary frequency, urinary hesitancy MS: Denies joint pain, muscle weakness, cramps, or limitation of movement.  Derm: Denies rash, itching, dry skin Psych: Denies depression, anxiety, memory loss, and confusion Heme: Denies bruising, bleeding, and enlarged lymph nodes.  Physical Exam: BP 135/81   Pulse 85   Temp (!) 96.8 F (36 C) (Temporal)   Ht 6' 1.5" (1.867 m)   Wt 228 lb 9.6 oz (103.7 kg)   BMI 29.75 kg/m  General:   Alert and oriented. Pleasant and cooperative. Well-nourished and well-developed.  Head:  Normocephalic and atraumatic. Eyes:  Without icterus, sclera clear and conjunctiva pink.  Ears:  Normal auditory acuity. Mouth:  Mask in place Lungs:  Clear to auscultation bilaterally. No wheezes, rales, or rhonchi. No distress.  Heart:  S1, S2 present without murmurs appreciated.  Abdomen:  +BS, soft, non-tender and non-distended. No HSM noted. No guarding or rebound. No masses appreciated.  Rectal:  Deferred  Msk:  Symmetrical without gross deformities. Normal posture. Extremities:  Without edema. Neurologic:  Alert and  oriented x4;  grossly normal neurologically. Skin:  Intact without significant lesions or rashes. Psych:  Alert and cooperative. Normal mood and affect.  ASSESSMENT: John Marquez is a 64 y.o. male presenting today with need for screening colonoscopy, last normal in 2010. He has no concerning GI signs/symptoms. No first-degree relatives with history of colorectal cancer or polyps, and he has no personal history of polyps.  Will pursue colonoscopy with Propofol due to polypharmacy.    PLAN:  Proceed with colonoscopy by Dr. Gala Romney in near future using  Propofol: the risks, benefits, and alternatives have been discussed with the patient in detail. The patient states understanding and desires to proceed.  Further recommendations to follow   Annitta Needs, PhD, ANP-BC [facility]

## 2020-04-22 NOTE — Patient Instructions (Signed)
We are arranging a colonoscopy in the near future!  Further recommendations to follow!  It was a pleasure to see you today. I want to create trusting relationships with patients to provide genuine, compassionate, and quality care. I value your feedback. If you receive a survey regarding your visit,  I greatly appreciate you taking time to fill this out.   Braeson Rupe W. Novella Abraha, PhD, ANP-BC [facility]   

## 2020-04-22 NOTE — Progress Notes (Signed)
Primary Care Physician:  Kathyrn Drown, MD  Referring Physician: Dr. Wolfgang Phoenix  Primary Gastroenterologist:  Dr. Gala Romney   Chief Complaint  Patient presents with  . Colonoscopy    HPI:   John Marquez is a 64 y.o. male presenting today at the request of Dr. Wolfgang Phoenix for screening colonoscopy. Last colonoscopy in 2010 normal. No first-degree relatives with history of colorectal cancer or polyps. He does note his paternal grandfather had colon cancer.   No abdominal pain, N/V, changes in bowel habits, overt GI bleeding, GERD exacerbations, dysphagia, unexplained weight loss, lack of appetite. No concerns today.   Past Medical History:  Diagnosis Date  . Allergic rhinitis   . Cancer ([place])   . 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 Eczema    [place]  . History of melanoma excision    07-23-2015 and 07-31-2015  mid upper back  . Hydronephrosis, right   . Restless legs syndrome (RLS) 08/21/2019  . Retroperitoneal lymphadenopathy     Past Surgical History:  Procedure Laterality Date  . COLONOSCOPY  2010   normal  . CYSTOSCOPY WITH STENT PLACEMENT Right 04/02/2016   Procedure: CYSTOSCOPY WITH RIGHT RETROGRADE PYELOGRAM AND STENT PLACEMENT;  Surgeon: Bjorn Loser, MD;  Location: [facility];  Service: Urology;  Laterality: Right;    Current Outpatient Medications  Medication Sig Dispense Refill  . cetirizine (ZYRTEC) 10 MG chewable tablet Chew 10 mg by mouth as needed for allergies.    . DULoxetine (CYMBALTA) 60 MG capsule TAKE 1 CAPSULE BY MOUTH EVERY DAY 90 capsule 0  . sildenafil (REVATIO) 20 MG tablet 3 to 5 before relations (Patient taking differently: as needed. 3 to 5 before relations) 50 tablet 3  . zolpidem (AMBIEN) 10 MG tablet TAKE 1 TABLET BY MOUTH EVERY DAY AT BEDTIME AS NEEDED 30 tablet 1   No current facility-administered medications for this visit.    Allergies as of 04/22/2020 - Review  Complete 04/22/2020  Allergen Reaction Noted  . Penicillins  11/08/2012  . Levaquin [levofloxacin]  04/01/2016    Family History  Problem Relation Age of Onset  . Hypertension Father   . Cancer Father        lung  . Dementia Mother   . Diabetes Mother   . Hypertension Brother     Social History   Socioeconomic History  . Marital status: Married    Spouse name: Not on file  . Number of children: Not on file  . Years of education: Not on file  . Highest education level: Not on file  Occupational History  . Not on file  Tobacco Use  . Smoking status: Never Smoker  . Smokeless tobacco: Never Used  Substance and Sexual Activity  . Alcohol use: Yes    Alcohol/week: 0.0 standard drinks    Comment: 2 times a week  . Drug use: No  . Sexual activity: Yes  Other Topics Concern  . Not on file  Social History Narrative  . Not on file   Social Determinants of Health   Financial Resource Strain:   . Difficulty of Paying Living Expenses: Not on file  Food Insecurity:   . Worried About Charity fundraiser in the Last Year: Not on file  . Ran Out of Food in the Last Year: Not on file  Transportation Needs:   . Lack of Transportation (Medical): Not on file  .  Lack of Transportation (Non-Medical): Not on file  Physical Activity:   . Days of Exercise per Week: Not on file  . Minutes of Exercise per Session: Not on file  Stress:   . Feeling of Stress : Not on file  Social Connections:   . Frequency of Communication with Friends and Family: Not on file  . Frequency of Social Gatherings with Friends and Family: Not on file  . Attends Religious Services: Not on file  . Active Member of Clubs or Organizations: Not on file  . Attends Archivist Meetings: Not on file  . Marital Status: Not on file  Intimate Partner Violence:   . Fear of Current or Ex-Partner: Not on file  . Emotionally Abused: Not on file  . Physically Abused: Not on file  . Sexually Abused: Not on  file    Review of Systems: Gen: Denies any fever, chills, fatigue, weight loss, lack of appetite.  CV: Denies chest pain, heart palpitations, peripheral edema, syncope.  Resp: Denies shortness of breath at rest or with exertion. Denies wheezing or cough.  GI: see HPI GU : Denies urinary burning, urinary frequency, urinary hesitancy MS: Denies joint pain, muscle weakness, cramps, or limitation of movement.  Derm: Denies rash, itching, dry skin Psych: Denies depression, anxiety, memory loss, and confusion Heme: Denies bruising, bleeding, and enlarged lymph nodes.  Physical Exam: BP 135/81   Pulse 85   Temp (!) 96.8 F (36 C) (Temporal)   Ht 6' 1.5" (1.867 m)   Wt 228 lb 9.6 oz (103.7 kg)   BMI 29.75 kg/m  General:   Alert and oriented. Pleasant and cooperative. Well-nourished and well-developed.  Head:  Normocephalic and atraumatic. Eyes:  Without icterus, sclera clear and conjunctiva pink.  Ears:  Normal auditory acuity. Mouth:  Mask in place Lungs:  Clear to auscultation bilaterally. No wheezes, rales, or rhonchi. No distress.  Heart:  S1, S2 present without murmurs appreciated.  Abdomen:  +BS, soft, non-tender and non-distended. No HSM noted. No guarding or rebound. No masses appreciated.  Rectal:  Deferred  Msk:  Symmetrical without gross deformities. Normal posture. Extremities:  Without edema. Neurologic:  Alert and  oriented x4;  grossly normal neurologically. Skin:  Intact without significant lesions or rashes. Psych:  Alert and cooperative. Normal mood and affect.  ASSESSMENT: John Marquez is a 64 y.o. male presenting today with need for screening colonoscopy, last normal in 2010. He has no concerning GI signs/symptoms. No first-degree relatives with history of colorectal cancer or polyps, and he has no personal history of polyps.  Will pursue colonoscopy with Propofol due to polypharmacy.    PLAN:  Proceed with colonoscopy by Dr. Gala Romney in near future using  Propofol: the risks, benefits, and alternatives have been discussed with the patient in detail. The patient states understanding and desires to proceed.  Further recommendations to follow   Annitta Needs, PhD, ANP-BC [facility]

## 2020-04-23 ENCOUNTER — Telehealth: Payer: Self-pay | Admitting: *Deleted

## 2020-04-23 NOTE — Telephone Encounter (Signed)
Called pt. He has been scheduled for TCS with propofol, Dr. Gala Romney, asa 2 on 11/11 at 9:30AM. Aware will need covid test prior to procedure and advised will mail with prep instructions. He voiced understanding   PA approved via [facility] website for TCS. Auth# [id number] DOS May 15, 2020 - Aug 13, 2020

## 2020-04-24 NOTE — Progress Notes (Signed)
Cc'ed to pcp °

## 2020-05-07 ENCOUNTER — Other Ambulatory Visit (HOSPITAL_COMMUNITY): Payer: 59

## 2020-05-13 ENCOUNTER — Encounter: Payer: Self-pay | Admitting: Family Medicine

## 2020-05-13 ENCOUNTER — Ambulatory Visit (INDEPENDENT_AMBULATORY_CARE_PROVIDER_SITE_OTHER): Payer: 59 | Admitting: Family Medicine

## 2020-05-13 ENCOUNTER — Other Ambulatory Visit: Payer: Self-pay

## 2020-05-13 VITALS — BP 130/82 | HR 91 | Temp 97.9°F | Wt 229.2 lb

## 2020-05-13 DIAGNOSIS — Z23 Encounter for immunization: Secondary | ICD-10-CM

## 2020-05-13 DIAGNOSIS — M722 Plantar fascial fibromatosis: Secondary | ICD-10-CM

## 2020-05-13 DIAGNOSIS — G2581 Restless legs syndrome: Secondary | ICD-10-CM | POA: Diagnosis not present

## 2020-05-13 DIAGNOSIS — T451X5A Adverse effect of antineoplastic and immunosuppressive drugs, initial encounter: Secondary | ICD-10-CM

## 2020-05-13 DIAGNOSIS — G4700 Insomnia, unspecified: Secondary | ICD-10-CM

## 2020-05-13 DIAGNOSIS — G620 Drug-induced polyneuropathy: Secondary | ICD-10-CM

## 2020-05-13 DIAGNOSIS — N529 Male erectile dysfunction, unspecified: Secondary | ICD-10-CM | POA: Diagnosis not present

## 2020-05-13 MED ORDER — DULOXETINE HCL 60 MG PO CPEP
60.0000 mg | ORAL_CAPSULE | Freq: Every day | ORAL | 1 refills | Status: DC
Start: 2020-05-13 — End: 2020-10-27

## 2020-05-13 MED ORDER — TADALAFIL 20 MG PO TABS: 10.0000 mg | ORAL_TABLET | ORAL | 11 refills | Status: DC | PRN

## 2020-05-13 MED ORDER — ZOLPIDEM TARTRATE 10 MG PO TABS
5.0000 mg | ORAL_TABLET | Freq: Every day | ORAL | 5 refills | Status: DC
Start: 2020-05-13 — End: 2020-10-27

## 2020-05-13 MED ORDER — DICLOFENAC SODIUM 75 MG PO TBEC: 75.0000 mg | DELAYED_RELEASE_TABLET | Freq: Two times a day (BID) | ORAL | 0 refills | Status: DC

## 2020-05-13 MED ORDER — ROPINIROLE HCL 0.5 MG PO TABS
0.5000 mg | ORAL_TABLET | Freq: Every evening | ORAL | 5 refills | Status: DC | PRN
Start: 2020-05-13 — End: 2020-10-27

## 2020-05-13 NOTE — Progress Notes (Signed)
   Subjective:    Patient ID: John Marquez, male    DOB: 1956-03-18, 64 y.o.   MRN: [id number]  HPI Pt here for follow up. Pt has colonoscopy scheduled for Thursday; due to have COVID test at 9:30 am. Pt has neuropathy in both feet; left heel- believes he has bone spur.   Need for vaccination - Plan: Flu Vaccine QUAD 6+ mos PF IM (Fluarix Quad PF)  Chemotherapy-induced neuropathy ([facility])  Insomnia, unspecified type  Restless legs syndrome (RLS)  Erectile dysfunction, unspecified erectile dysfunction type    Review of Systems  Constitutional: Negative for diaphoresis and fatigue.  HENT: Negative for congestion and rhinorrhea.   Respiratory: Negative for cough and shortness of breath.   Cardiovascular: Negative for chest pain and leg swelling.  Gastrointestinal: Negative for abdominal pain and diarrhea.  Skin: Negative for color change and rash.  Neurological: Negative for dizziness and headaches.  Psychiatric/Behavioral: Negative for behavioral problems and confusion.       Objective:   Physical Exam Vitals reviewed.  Constitutional:      General: He is not in acute distress. HENT:     Head: Normocephalic and atraumatic.  Eyes:     General:        Right eye: No discharge.        Left eye: No discharge.  Neck:     Trachea: No tracheal deviation.  Cardiovascular:     Rate and Rhythm: Normal rate and regular rhythm.     Heart sounds: Normal heart sounds. No murmur heard.   Pulmonary:     Effort: Pulmonary effort is normal. No respiratory distress.     Breath sounds: Normal breath sounds.  Lymphadenopathy:     Cervical: No cervical adenopathy.  Skin:    General: Skin is warm and dry.  Neurological:     Mental Status: He is alert.     Coordination: Coordination normal.  Psychiatric:        Behavior: Behavior normal.   30 minutes spent with his standard health issues plus also for plantar fasciitis chart review and documentation thank you Left heel pain discomfort  stretches were shown pain patient has plantar fasciitis been going on for several months I doubt the heel spurs causing much of the issue if he does not improve with anti-inflammatories I recommend follow-up with [place] or call us and we will help set up with podiatry  Has colonoscopy coming up this week     Assessment & Plan:  1. Need for vaccination Flu shot today - Flu Vaccine QUAD 6+ mos PF IM (Fluarix Quad PF)  2. Chemotherapy-induced neuropathy (HCC) Has neuropathy takes Cymbalta seems to help some continue this  3. Insomnia, unspecified type Insomnia issues uses Ambien on a as needed basis either half or whole 1 as directed caution drowsiness  4. Restless legs syndrome (RLS) Restless leg medicine on a as needed basis when it flares up  5. Erectile dysfunction, unspecified erectile dysfunction type He would like to try Cialis.  Labs in the spring with a wellness visit at that time

## 2020-05-13 NOTE — Patient Instructions (Signed)
Vit b12 OTC 1000 mcg one daily

## 2020-05-14 ENCOUNTER — Other Ambulatory Visit (HOSPITAL_COMMUNITY)
Admission: RE | Admit: 2020-05-14 | Discharge: 2020-05-14 | Disposition: A | Discharge status: Home / Self Care | Payer: Medicare Other | Source: Ambulatory Visit | Attending: Internal Medicine | Admitting: Internal Medicine

## 2020-05-14 DIAGNOSIS — Z01812 Encounter for preprocedural laboratory examination: Secondary | ICD-10-CM | POA: Insufficient documentation

## 2020-05-14 DIAGNOSIS — Z20822 Contact with and (suspected) exposure to covid-19: Secondary | ICD-10-CM | POA: Insufficient documentation

## 2020-05-14 LAB — SARS CORONAVIRUS 2 (TAT 6-24 HRS): SARS Coronavirus 2: NEGATIVE

## 2020-05-15 ENCOUNTER — Encounter (HOSPITAL_COMMUNITY)
Admission: RE | Disposition: A | Discharge status: Home / Self Care | Payer: Self-pay | Source: Home / Self Care | Attending: Internal Medicine

## 2020-05-15 ENCOUNTER — Other Ambulatory Visit: Payer: Self-pay

## 2020-05-15 ENCOUNTER — Ambulatory Visit (HOSPITAL_COMMUNITY)
Admission: RE | Admit: 2020-05-15 | Discharge: 2020-05-15 | Disposition: A | Discharge status: Home / Self Care | Payer: Medicare Other | Attending: Internal Medicine | Admitting: Internal Medicine

## 2020-05-15 ENCOUNTER — Ambulatory Visit (HOSPITAL_COMMUNITY): Payer: Medicare Other | Admitting: Anesthesiology

## 2020-05-15 ENCOUNTER — Encounter (HOSPITAL_COMMUNITY): Payer: Self-pay | Admitting: Internal Medicine

## 2020-05-15 DIAGNOSIS — Z1211 Encounter for screening for malignant neoplasm of colon: Secondary | ICD-10-CM

## 2020-05-15 DIAGNOSIS — Z79899 Other long term (current) drug therapy: Secondary | ICD-10-CM | POA: Insufficient documentation

## 2020-05-15 DIAGNOSIS — Z800 Family history of malignant neoplasm of digestive organs: Secondary | ICD-10-CM | POA: Diagnosis not present

## 2020-05-15 HISTORY — PX: COLONOSCOPY WITH PROPOFOL: SHX5780

## 2020-05-15 SURGERY — COLONOSCOPY WITH PROPOFOL
Anesthesia: General

## 2020-05-15 MED ORDER — PROPOFOL 500 MG/50ML IV EMUL
INTRAVENOUS | Status: DC | PRN
  Administered 2020-05-15: 150 ug/kg/min via INTRAVENOUS

## 2020-05-15 MED ORDER — CHLORHEXIDINE GLUCONATE CLOTH 2 % EX PADS: 6.0000 | MEDICATED_PAD | Freq: Once | CUTANEOUS | Status: DC

## 2020-05-15 MED ORDER — STERILE WATER FOR IRRIGATION IR SOLN
Status: DC | PRN
  Administered 2020-05-15: 100 mL

## 2020-05-15 MED ORDER — LACTATED RINGERS IV SOLN: INTRAVENOUS | Status: DC | PRN

## 2020-05-15 MED ORDER — PROPOFOL 10 MG/ML IV BOLUS
INTRAVENOUS | Status: DC | PRN
  Administered 2020-05-15: 100 mg via INTRAVENOUS

## 2020-05-15 MED ORDER — LACTATED RINGERS IV SOLN: Freq: Once | INTRAVENOUS | Status: AC

## 2020-05-15 NOTE — Interval H&P Note (Signed)
History and Physical Interval Note:  05/15/2020 8:15 AM  John Marquez  has presented today for surgery, with the diagnosis of screening.  The various methods of treatment have been discussed with the patient and family. After consideration of risks, benefits and other options for treatment, the patient has consented to  Procedure(s) with comments: COLONOSCOPY WITH PROPOFOL (N/A) - 9:30AM as a surgical intervention.  The patient's history has been reviewed, patient examined, no change in status, stable for surgery.  I have reviewed the patient's chart and labs.  Questions were answered to the patient's satisfaction.     Manus Rudd  Patient with out change.  Average rescreening colonoscopy today per plan.  The risks, benefits, limitations, alternatives and imponderables have been reviewed with the patient. Questions have been answered. All parties are agreeable.

## 2020-05-15 NOTE — Anesthesia Preprocedure Evaluation (Addendum)
Anesthesia Evaluation  Patient identified by MRN, date of birth, ID band Patient awake    Reviewed: Allergy & Precautions, NPO status , Patient's Chart, lab work & pertinent test results  History of Anesthesia Complications Negative for: history of anesthetic complications  Airway Mallampati: II  TM Distance: >3 FB Neck ROM: Full    Dental  (+) Dental Advisory Given, Teeth Intact   Pulmonary shortness of breath and with exertion,    Pulmonary exam normal breath sounds clear to auscultation       Cardiovascular METS: 3 - Mets Normal cardiovascular exam Rhythm:Regular Rate:Normal     Neuro/Psych  Neuromuscular disease (chemo induced neuropathy) negative psych ROS   GI/Hepatic negative GI ROS, Neg liver ROS,   Endo/Other  negative endocrine ROS  Renal/GU Renal disease     Musculoskeletal   Abdominal   Peds  Hematology  (+) Blood dyscrasia (thrombocytopenia), ,   Anesthesia Other Findings Diffuse B cell lymphoma  Reproductive/Obstetrics                            Anesthesia Physical Anesthesia Plan  ASA: III  Anesthesia Plan: General   Post-op Pain Management:    Induction: Intravenous  PONV Risk Score and Plan: TIVA  Airway Management Planned: Nasal Cannula, Natural Airway and Simple Face Mask  Additional Equipment:   Intra-op Plan:   Post-operative Plan:   Informed Consent: I have reviewed the patients History and Physical, chart, labs and discussed the procedure including the risks, benefits and alternatives for the proposed anesthesia with the patient or authorized representative who has indicated his/her understanding and acceptance.     Dental advisory given  Plan Discussed with: Surgeon and CRNA  Anesthesia Plan Comments:        Anesthesia Quick Evaluation

## 2020-05-15 NOTE — Op Note (Signed)
[facility] Patient Name: John Marquez Procedure Date: 05/15/2020 8:41 AM MRN: [id number] Date of Birth: 08/02/1955 Attending MD: Norvel Richards , MD CSN: [id number] Age: 64 Admit Type: Outpatient Procedure:                Colonoscopy Indications:              Screening for colorectal malignant neoplasm Providers:                Norvel Richards, MD, Lambert Mody,                            Raphael Gibney, Technician Referring MD:              Medicines:                Propofol per Anesthesia Complications:            No immediate complications. Estimated Blood Loss:     Estimated blood loss: none. Procedure:                Pre-Anesthesia Assessment:                           - Prior to the procedure, a History and Physical                            was performed, and patient medications and                            allergies were reviewed. The patient's tolerance of                            previous anesthesia was also reviewed. The risks                            and benefits of the procedure and the sedation                            options and risks were discussed with the patient.                            All questions were answered, and informed consent                            was obtained. Prior Anticoagulants: The patient has                            taken no previous anticoagulant or antiplatelet                            agents. ASA Grade Assessment: II - A patient with                            mild systemic disease. After reviewing the risks  and benefits, the patient was deemed in                            satisfactory condition to undergo the procedure.                           After obtaining informed consent, the colonoscope                            was passed under direct vision. Throughout the                            procedure, the patient's blood pressure, pulse, and                             oxygen saturations were monitored continuously. The                            CF-[id number] ([id number]) scope was introduced through                            the anus and advanced to the the cecum, identified                            by appendiceal orifice and ileocecal valve. The                            colonoscopy was performed without difficulty. The                            patient tolerated the procedure well. The quality                            of the bowel preparation was adequate. Scope In: 8:46:31 AM Scope Out: 8:58:47 AM Scope Withdrawal Time: 0 hours 6 minutes 16 seconds  Total Procedure Duration: 0 hours 12 minutes 16 seconds  Findings:      The perianal and digital rectal examinations were normal.      The colon (entire examined portion) appeared normal. Estimated blood       loss: none.      The retroflexed view of the distal rectum and anal verge was normal and       showed no anal or rectal abnormalities. Impression:               - The entire examined colon is normal.                           - The distal rectum and anal verge are normal on                            retroflexion view.                           - No specimens collected. Moderate Sedation:      Moderate (conscious) sedation was personally  administered by an       anesthesia professional. The following parameters were monitored: oxygen       saturation, heart rate, blood pressure, respiratory rate, EKG, adequacy       of pulmonary ventilation, and response to care. Recommendation:           - Patient has a contact number available for                            emergencies. The signs and symptoms of potential                            delayed complications were discussed with the                            patient. Return to normal activities tomorrow.                            Written discharge instructions were provided to the                            patient.                           -  Resume previous diet.                           - Continue present medications.                           - Repeat colonoscopy in 10 years for screening                            purposes.                           - Return to GI office in 10 years. Procedure Code(s):        --- Professional ---                           [phone], Colonoscopy, flexible; diagnostic, including                            collection of specimen(s) by brushing or washing,                            when performed (separate procedure) Diagnosis Code(s):        --- Professional ---                           Z12.11, Encounter for screening for malignant                            neoplasm of colon CPT copyright 2019 [employer]. All rights reserved. The codes documented in this report are preliminary and upon coder review may  be revised to meet current compliance requirements. Cristopher Estimable. Gala Romney, MD Herbie Baltimore  Garfield Cornea, MD 05/15/2020 9:07:27 AM This report has been signed electronically. Number of Addenda: 0

## 2020-05-15 NOTE — Anesthesia Postprocedure Evaluation (Signed)
Anesthesia Post Note  Patient: John Marquez  Procedure(s) Performed: COLONOSCOPY WITH PROPOFOL (N/A )  Patient location during evaluation: PACU Anesthesia Type: General Level of consciousness: awake, oriented, awake and alert and patient cooperative Pain management: satisfactory to patient Vital Signs Assessment: post-procedure vital signs reviewed and stable Respiratory status: spontaneous breathing, respiratory function stable and nonlabored ventilation Cardiovascular status: stable Postop Assessment: no apparent nausea or vomiting Anesthetic complications: no   No complications documented.   Last Vitals:  Vitals:   05/15/20 0804 05/15/20 0903  BP: 131/80   Pulse: 66 66  Resp: (!) 21 14  Temp: 36.6 C 36.5 C  SpO2: 97% 97%    Last Pain:  Vitals:   05/15/20 0903  TempSrc: Oral  PainSc:                  Willa Rough

## 2020-05-15 NOTE — Transfer of Care (Signed)
Immediate Anesthesia Transfer of Care Note  Patient: John Marquez  Procedure(s) Performed: COLONOSCOPY WITH PROPOFOL (N/A )  Patient Location: PACU  Anesthesia Type:General  Level of Consciousness: awake, alert , oriented and patient cooperative  Airway & Oxygen Therapy: Patient Spontanous Breathing  Post-op Assessment: Report given to RN, Post -op Vital signs reviewed and stable and Patient moving all extremities X 4  Post vital signs: Reviewed and stable  Last Vitals:  Vitals Value Taken Time  BP    Temp    Pulse    Resp    SpO2      Last Pain:  Vitals:   05/15/20 0841  TempSrc:   PainSc: 0-No pain         Complications: No complications documented.

## 2020-05-15 NOTE — Discharge Instructions (Signed)
  Colonoscopy Discharge Instructions  Read the instructions outlined below and refer to this sheet in the next few weeks. These discharge instructions provide you with general information on caring for yourself after you leave the hospital. Your doctor may also give you specific instructions. While your treatment has been planned according to the most current medical practices available, unavoidable complications occasionally occur. If you have any problems or questions after discharge, call Dr. Gala Romney at [phone]. ACTIVITY  You may resume your regular activity, but move at a slower pace for the next 24 hours.   Take frequent rest periods for the next 24 hours.   Walking will help get rid of the air and reduce the bloated feeling in your belly (abdomen).   No driving for 24 hours (because of the medicine (anesthesia) used during the test).    Do not sign any important legal documents or operate any machinery for 24 hours (because of the anesthesia used during the test).  NUTRITION  Drink plenty of fluids.   You may resume your normal diet as instructed by your doctor.   Begin with a light meal and progress to your normal diet. Heavy or fried foods are harder to digest and may make you feel sick to your stomach (nauseated).   Avoid alcoholic beverages for 24 hours or as instructed.  MEDICATIONS  You may resume your normal medications unless your doctor tells you otherwise.  WHAT YOU CAN EXPECT TODAY  Some feelings of bloating in the abdomen.   Passage of more gas than usual.   Spotting of blood in your stool or on the toilet paper.  IF YOU HAD POLYPS REMOVED DURING THE COLONOSCOPY:  No aspirin products for 7 days or as instructed.   No alcohol for 7 days or as instructed.   Eat a soft diet for the next 24 hours.  FINDING OUT THE RESULTS OF YOUR TEST Not all test results are available during your visit. If your test results are not back during the visit, make an appointment  with your caregiver to find out the results. Do not assume everything is normal if you have not heard from your caregiver or the medical facility. It is important for you to follow up on all of your test results.  SEEK IMMEDIATE MEDICAL ATTENTION IF:  You have more than a spotting of blood in your stool.   Your belly is swollen (abdominal distention).   You are nauseated or vomiting.   You have a temperature over 101.   You have abdominal pain or discomfort that is severe or gets worse throughout the day.   Your colonoscopy was normal today.  I recommend you return in 10 years for repeat screening examination  At patient request I called Manuela Schwartz at [phone]

## 2020-05-21 ENCOUNTER — Encounter (HOSPITAL_COMMUNITY): Payer: Self-pay | Admitting: Internal Medicine

## 2020-09-02 ENCOUNTER — Telehealth: Payer: Self-pay | Admitting: *Deleted

## 2020-09-02 NOTE — Telephone Encounter (Signed)
Please schedule pt wellness in April and then send back after appt is made so bw orders can be put in for pt to do before appt.

## 2020-09-03 NOTE — Telephone Encounter (Signed)
Patient made appointment in April

## 2020-09-04 ENCOUNTER — Emergency Department (HOSPITAL_COMMUNITY)
Admission: EM | Admit: 2020-09-04 | Discharge: 2020-09-04 | Disposition: A | Discharge status: Home / Self Care | Payer: Medicare Other | Attending: Emergency Medicine | Admitting: Emergency Medicine

## 2020-09-04 ENCOUNTER — Other Ambulatory Visit: Payer: Self-pay

## 2020-09-04 ENCOUNTER — Encounter (HOSPITAL_COMMUNITY): Payer: Self-pay | Admitting: Emergency Medicine

## 2020-09-04 ENCOUNTER — Emergency Department (HOSPITAL_COMMUNITY): Payer: Medicare Other

## 2020-09-04 DIAGNOSIS — S8991XA Unspecified injury of right lower leg, initial encounter: Secondary | ICD-10-CM | POA: Diagnosis present

## 2020-09-04 DIAGNOSIS — W540XXA Bitten by dog, initial encounter: Secondary | ICD-10-CM | POA: Insufficient documentation

## 2020-09-04 DIAGNOSIS — S81851A Open bite, right lower leg, initial encounter: Secondary | ICD-10-CM | POA: Diagnosis not present

## 2020-09-04 DIAGNOSIS — Z859 Personal history of malignant neoplasm, unspecified: Secondary | ICD-10-CM | POA: Insufficient documentation

## 2020-09-04 DIAGNOSIS — Z85820 Personal history of malignant melanoma of skin: Secondary | ICD-10-CM | POA: Insufficient documentation

## 2020-09-04 MED ORDER — DOUBLE ANTIBIOTIC 500-10000 UNIT/GM EX OINT
TOPICAL_OINTMENT | Freq: Once | CUTANEOUS | Status: AC
  Filled 2020-09-04: qty 1

## 2020-09-04 MED ORDER — AMOXICILLIN-POT CLAVULANATE 875-125 MG PO TABS: 1.0000 | ORAL_TABLET | Freq: Two times a day (BID) | ORAL | 0 refills | Status: AC

## 2020-09-04 NOTE — ED Provider Notes (Signed)
[facility] Provider Note   CSN: [id number] Arrival date & time: 09/04/20  1500     History Chief Complaint  Patient presents with  . Animal Bite    John Marquez is a 65 y.o. male.  65 year old male with history of lymphoma presents with complaint of dog bite to the right lower leg.  Patient states that he was going to check on attendant in a rental unit when the tenants dog bit him in the right lower leg.  Bleeding is controlled, patient has been ambulatory without difficulty since.  Last tetanus was in 2018.  Animal control has been notified to verify rabies vaccine status on the dog.  No other injuries or concerns.        Past Medical History:  Diagnosis Date  . Allergic rhinitis   . Cancer ([place])   . 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 Eczema    Winter  . History of melanoma excision    07-23-2015 and 07-31-2015  mid upper back  . Hydronephrosis, right   . Restless legs syndrome (RLS) 08/21/2019  . Retroperitoneal lymphadenopathy     Patient Active Problem List   Diagnosis Date Noted  . Encounter for screening colonoscopy 04/22/2020  . Restless legs syndrome (RLS) 08/21/2019  . Insomnia 01/29/2019  . 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 Erectile dysfunction 07/16/2017  . Crush injury of toe, right, initial encounter 07/19/2016  . Toe injury, right, initial encounter 07/19/2016  . Hematuria 04/30/2016  . 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 Melanoma in situ ([place]) 08/10/2015  . Prediabetes 05/14/2013    Past Surgical History:  Procedure Laterality Date  . COLONOSCOPY  2010   normal  . COLONOSCOPY WITH PROPOFOL N/A 05/15/2020   Procedure: COLONOSCOPY WITH PROPOFOL;  Surgeon: Daneil Dolin, MD;  Location: [facility];   Service: Endoscopy;  Laterality: N/A;  9:30AM  . CYSTOSCOPY WITH STENT PLACEMENT Right 04/02/2016   Procedure: CYSTOSCOPY WITH RIGHT RETROGRADE PYELOGRAM AND STENT PLACEMENT;  Surgeon: Bjorn Loser, MD;  Location: [facility] ORS;  Service: Urology;  Laterality: Right;       Family History  Problem Relation Age of Onset  . Hypertension Father   . Cancer Father        lung  . Dementia Mother   . Diabetes Mother   . Hypertension Brother     Social History   Tobacco Use  . Smoking status: Never Smoker  . Smokeless tobacco: Never Used  Substance Use Topics  . Alcohol use: Yes    Alcohol/week: 0.0 standard drinks    Comment: 2 times a week  . Drug use: No    Home Medications Prior to Admission medications   Medication Sig Start Date End Date Taking? Authorizing Provider  amoxicillin-clavulanate (AUGMENTIN) 875-125 MG tablet Take 1 tablet by mouth every 12 (twelve) hours for 5 days. 09/04/20 09/09/20 Yes Tacy Learn, PA-C  cetirizine (ZYRTEC) 10 MG tablet Take 10 mg by mouth daily as needed for allergies.    [provider]  diclofenac (VOLTAREN) 75 MG EC tablet Take 1 tablet (75 mg total) by mouth 2 (two) times daily. 05/13/20   Kathyrn Drown, MD  DULoxetine (CYMBALTA) 60 MG capsule Take 1 capsule (60 mg total) by mouth daily. 05/13/20   Sallee Lange  A, MD  rOPINIRole (REQUIP) 0.5 MG tablet Take 1 tablet (0.5 mg total) by mouth at bedtime as needed (restless legs). 05/13/20   Kathyrn Drown, MD  tadalafil (CIALIS) 20 MG tablet Take 0.5-1 tablets (10-20 mg total) by mouth every other day as needed for erectile dysfunction. 05/13/20   Kathyrn Drown, MD  zolpidem (AMBIEN) 10 MG tablet Take 0.5-1 tablets (5-10 mg total) by mouth at bedtime. 05/13/20   Kathyrn Drown, MD    Allergies    Penicillins and Levaquin [levofloxacin]  Review of Systems   Review of Systems  Constitutional: Negative for fever.  Musculoskeletal: Negative for arthralgias and myalgias.  Skin: Positive  for wound.  Allergic/Immunologic: Negative for immunocompromised state.  Neurological: Negative for weakness and numbness.  Hematological: Does not bruise/bleed easily.  All other systems reviewed and are negative.   Physical Exam Updated Vital Signs BP (!) 150/99 (BP Location: Right Arm)   Pulse 81   Temp 98.1 F (36.7 C) (Oral)   Resp 16   Ht 6\' 1"  (1.854 m)   Wt 102.1 kg   SpO2 97%   BMI 29.69 kg/m   Physical Exam Vitals and nursing note reviewed.  Constitutional:      General: He is not in acute distress.    Appearance: He is well-developed and well-nourished. He is not diaphoretic.  HENT:     Head: Normocephalic and atraumatic.  Cardiovascular:     Pulses: Normal pulses.  Pulmonary:     Effort: Pulmonary effort is normal.  Musculoskeletal:        General: Tenderness present. No swelling.       Legs:  Skin:    General: Skin is warm and dry.  Neurological:     Mental Status: He is alert and oriented to person, place, and time.     Sensory: No sensory deficit.     Motor: No weakness.  Psychiatric:        Mood and Affect: Mood and affect normal.        Behavior: Behavior normal.     ED Results / Procedures / Treatments   Labs (all labs ordered are listed, but only abnormal results are displayed) Labs Reviewed - No data to display  EKG None  Radiology DG Tibia/Fibula Right  Result Date: 09/04/2020 CLINICAL DATA:  Dog bite EXAM: RIGHT TIBIA AND FIBULA - 2 VIEW COMPARISON:  None. FINDINGS: Frontal and lateral views of the right tibia and fibula are obtained. There are no acute displaced fractures. No radiopaque foreign bodies. Alignment of the right knee and ankle is anatomic. Soft tissues are normal. IMPRESSION: 1. No fracture or radiopaque foreign body. Electronically Signed   By: Randa Ngo M.D.   On: 09/04/2020 17:04    Procedures Procedures   Medications Ordered in ED Medications  polymixin-bacitracin (POLYSPORIN) ointment (has no administration  in time range)    ED Course  I have reviewed the triage vital signs and the nursing notes.  Pertinent labs & imaging results that were available during my care of the patient were reviewed by me and considered in my medical decision making (see chart for details).  Clinical Course as of 09/04/20 1708  Thu Mar 03, 822  [id number] 65 year old male with dog bite to the right lower leg.  Wound appears nongaping, bleeding controlled.  Wound was cleaned, plan is to plan medical admit.  X-rays negative for foreign body.  Patient is talking with animal control now, discussed need for rabies vaccines  within the next 10 days if unable to verify dog's vaccine status or if dog fails quarantine. Patient has a penicillin allergy however has taken amoxicillin safely in the past, will give prescription for Augmentin, recommend wound check with his doctor in 2 days. [LM]    Clinical Course User Index [LM] Roque Lias   MDM Rules/Calculators/A&P                          Final Clinical Impression(s) / ED Diagnoses Final diagnoses:  Dog bite, initial encounter    Rx / DC Orders ED Discharge Orders         Ordered    amoxicillin-clavulanate (AUGMENTIN) 875-125 MG tablet  Every 12 hours        09/04/20 1707           Tacy Learn, PA-C 09/04/20 1708    Milton Ferguson, MD 09/04/20 2227

## 2020-09-04 NOTE — ED Notes (Signed)
Animal Control is report is complete.

## 2020-09-04 NOTE — ED Triage Notes (Signed)
Pt c/o dog bite to the right leg that happened about an hour ago. Bleeding controlled at this time.

## 2020-09-04 NOTE — Discharge Instructions (Signed)
Keep wounds clean and dry. Apply Bacitracin twice daily. Take Antibiotics as prescribed.  Wound check with your doctor in 2 days, return to the ER as needed. Follow up with animal control regarding rabies vaccine status on the dog. As discussed rabies vaccines will need to begin within 10 days of bite if dog's vaccine status is not available (tenant hides the dog), or if dog becomes ill in quarantine.

## 2020-09-04 NOTE — ED Notes (Signed)
Animal Control At Bedside.

## 2020-09-05 ENCOUNTER — Telehealth: Payer: Self-pay

## 2020-09-05 NOTE — Telephone Encounter (Signed)
Transition Care Management Follow-up Telephone Call  Date of discharge and from where: 09/04/2020 from [facility]  How have you been since you were released from the hospital? Pt states that he is feeling well. Pt has no questions or concerns.   Any questions or concerns? No  Items Reviewed:  Did the pt receive and understand the discharge instructions provided? Yes   Medications obtained and verified? Yes   Other? No   Any new allergies since your discharge? No   Dietary orders reviewed? N/A  Do you have support at home? Yes    Functional Questionnaire: (I = Independent and D = Dependent) ADLs: I  Bathing/Dressing- I  Meal Prep- I  Eating- I  Maintaining continence- I  Transferring/Ambulation- I  Managing Meds- I   Follow up appointments reviewed:   PCP Hospital f/u appt confirmed? No    Specialist Hospital f/u appt confirmed? No    Are transportation arrangements needed? No   If their condition worsens, is the pt aware to call PCP or go to the Emergency Dept.? Yes  Was the patient provided with contact information for the PCP's office or ED? Yes  Was to pt encouraged to call back with questions or concerns? Yes

## 2020-10-27 ENCOUNTER — Other Ambulatory Visit: Payer: Self-pay

## 2020-10-27 ENCOUNTER — Ambulatory Visit (INDEPENDENT_AMBULATORY_CARE_PROVIDER_SITE_OTHER): Payer: 59 | Admitting: Family Medicine

## 2020-10-27 ENCOUNTER — Encounter: Payer: Self-pay | Admitting: Family Medicine

## 2020-10-27 VITALS — BP 134/84 | HR 70 | Temp 96.8°F | Ht 72.0 in | Wt 221.6 lb

## 2020-10-27 DIAGNOSIS — Z0000 Encounter for general adult medical examination without abnormal findings: Secondary | ICD-10-CM

## 2020-10-27 DIAGNOSIS — Z79899 Other long term (current) drug therapy: Secondary | ICD-10-CM

## 2020-10-27 DIAGNOSIS — G620 Drug-induced polyneuropathy: Secondary | ICD-10-CM | POA: Diagnosis not present

## 2020-10-27 DIAGNOSIS — G4700 Insomnia, unspecified: Secondary | ICD-10-CM

## 2020-10-27 DIAGNOSIS — T451X5A Adverse effect of antineoplastic and immunosuppressive drugs, initial encounter: Secondary | ICD-10-CM

## 2020-10-27 DIAGNOSIS — Z125 Encounter for screening for malignant neoplasm of prostate: Secondary | ICD-10-CM

## 2020-10-27 DIAGNOSIS — G2581 Restless legs syndrome: Secondary | ICD-10-CM

## 2020-10-27 NOTE — Progress Notes (Signed)
Subjective:    Patient ID: John Marquez, male    DOB: 02-13-1956, 65 y.o.   MRN: [id number]  HPI The patient comes in today for a wellness visit.  Well adult exam - Plan: Methylmalonic Acid, B12, Lipid Profile  Chemotherapy-induced neuropathy ([place]) - Plan: Methylmalonic Acid, B12, Lipid Profile  Restless legs syndrome (RLS) - Plan: Methylmalonic Acid, B12, Lipid Profile  Insomnia, unspecified type - Plan: Methylmalonic Acid, B12, Lipid Profile  High risk medication use - Plan: Methylmalonic Acid, B12, Lipid Profile  Screening PSA (prostate specific antigen) - Plan: PSA  This patient has significant neuropathy that is getting worse it is coming up his legs that causes him to have a lot of burning and pain in his feet this was induced by chemotherapy.  There is no cure.  He finds himself having a lot of imbalance.  Has a hard time on uneven surfaces and a difficult time with going up any type of steps or ladders.  Patient is disabled from this.  This will not get better as he gets older and in fact will get worse  Does have underlying insomnia issues takes Ambien but only uses it periodically 5 mg or sometimes 10  Does have restless legs medication seems to help  A review of their health history was completed.  A review of medications was also completed.  Any needed refills; not at this time  Eating habits: healthy   Falls/  MVA accidents in past few months: none  Regular exercise: work out Monday through Friday   Specialist pt sees on regular basis: [facility]   Preventative health issues were discussed.   Additional concerns: none    Review of Systems  Constitutional: Negative for activity change.  HENT: Negative for congestion and rhinorrhea.   Respiratory: Negative for cough and shortness of breath.   Cardiovascular: Negative for chest pain.  Gastrointestinal: Negative for abdominal pain, diarrhea, nausea and vomiting.  Genitourinary: Negative for  dysuria and hematuria.  Neurological: Negative for weakness and headaches.  Psychiatric/Behavioral: Negative for behavioral problems and confusion.       Objective:   Physical Exam Vitals reviewed.  Constitutional:      General: He is not in acute distress.    Appearance: He is well-developed.  HENT:     Head: Normocephalic.  Cardiovascular:     Rate and Rhythm: Normal rate and regular rhythm.     Heart sounds: Normal heart sounds. No murmur heard.   Pulmonary:     Effort: Pulmonary effort is normal.     Breath sounds: Normal breath sounds.  Lymphadenopathy:     Cervical: No cervical adenopathy.  Skin:    General: Skin is warm and dry.  Neurological:     Mental Status: He is alert.  Psychiatric:        Behavior: Behavior normal.    Neuropathy is noted in both feet all the way up to the midcalf with monofilament       Assessment & Plan:  1. Well adult exam Adult wellness-complete.wellness physical was conducted today. Importance of diet and exercise were discussed in detail.  In addition to this a discussion regarding safety was also covered. We also reviewed over immunizations and gave recommendations regarding current immunization needed for age.  In addition to this additional areas were also touched on including: Preventative health exams needed:  Colonoscopy up-to-date on colonoscopy Next colonoscopy 2031 Patient was advised yearly wellness exam  - Methylmalonic Acid -  B12 - Lipid Profile  2. Chemotherapy-induced neuropathy ([facility]) Will check B12 to make sure that this is not an issue with his neuropathy - Methylmalonic Acid - B12 - Lipid Profile  3. Restless legs syndrome (RLS) Continue medication as directed - Methylmalonic Acid - B12 - Lipid Profile  4. Insomnia, unspecified type Encourage patient to reduce his Ambien to just 5 mg each evening and try at times to be without - Methylmalonic Acid - B12 - Lipid Profile  5. High risk medication  use See above - Methylmalonic Acid - B12 - Lipid Profile  6. Screening PSA (prostate specific antigen) PSA ordered - PSA   Patient is still disabled with this he will never be able to go back to work.  Has severe neuropathy in the feet along with some neuropathic pain and imbalance issues

## 2020-10-28 MED ORDER — ROPINIROLE HCL 0.5 MG PO TABS: 0.5000 mg | ORAL_TABLET | Freq: Every evening | ORAL | 1 refills | Status: DC | PRN

## 2020-10-28 MED ORDER — ZOLPIDEM TARTRATE 10 MG PO TABS: 5.0000 mg | ORAL_TABLET | Freq: Every day | ORAL | 5 refills | Status: DC

## 2020-10-28 MED ORDER — DULOXETINE HCL 60 MG PO CPEP: 60.0000 mg | ORAL_CAPSULE | Freq: Every day | ORAL | 1 refills | Status: DC

## 2020-11-02 ENCOUNTER — Telehealth: Payer: Self-pay | Admitting: Family Medicine

## 2020-11-02 LAB — LIPID PANEL
Chol/HDL Ratio: 3.1 ratio (ref 0.0–5.0)
Cholesterol, Total: 156 mg/dL (ref 100–199)
HDL: 51 mg/dL (ref >39)
LDL Chol Calc (NIH): 87 mg/dL (ref 0–99)
Triglycerides: 97 mg/dL (ref 0–149)
VLDL Cholesterol Cal: 18 mg/dL (ref 5–40)

## 2020-11-02 LAB — PSA: Prostate Specific Ag, Serum: 1.2 ng/mL (ref 0.0–4.0)

## 2020-11-02 LAB — VITAMIN B12: Vitamin B-12: 331 pg/mL (ref 232–1245)

## 2020-11-02 LAB — METHYLMALONIC ACID, SERUM: Methylmalonic Acid: 680 nmol/L — ABNORMAL HIGH (ref 0–378)

## 2020-11-02 NOTE — Telephone Encounter (Signed)
[name] I am filling out this patient's disability form Is there a previous disability form on file that might help or assist with doing this?  If not there are some areas that need to be clarified with John Marquez before filling this out so that the form can be 100% accurate The following needs to be covered if we are unable to find a previous disability form that would help this information: If we have the previous information on a disability form that is scanned into the system he will not need to answer the following questions  John Marquez had lymphoma back in September 2017. 1 on the first mentions of neuropathy was March 2018 I was able to get these dates from his medical record both here at the office as well as with the specialist Do these dates seem correct to the patient?  Also does patient recall when his last month or date of work was?

## 2020-11-03 NOTE — Telephone Encounter (Signed)
Put copy for form from 2021 in your folder in your box

## 2020-11-04 NOTE — Telephone Encounter (Signed)
Minimal-what ever that is-$10 I guess

## 2020-11-04 NOTE — Telephone Encounter (Signed)
Thank you for the additional help and copies  form was completed

## 2020-11-04 NOTE — Telephone Encounter (Signed)
Is there a charge for this form

## 2020-11-05 DIAGNOSIS — Z029 Encounter for administrative examinations, unspecified: Secondary | ICD-10-CM

## 2020-11-18 ENCOUNTER — Other Ambulatory Visit: Payer: Self-pay | Admitting: Family Medicine

## 2020-11-18 DIAGNOSIS — E538 Deficiency of other specified B group vitamins: Secondary | ICD-10-CM

## 2021-01-06 ENCOUNTER — Ambulatory Visit (INDEPENDENT_AMBULATORY_CARE_PROVIDER_SITE_OTHER): Payer: Medicare Other | Admitting: Family Medicine

## 2021-01-06 ENCOUNTER — Telehealth: Payer: Self-pay | Admitting: Family Medicine

## 2021-01-06 ENCOUNTER — Encounter: Payer: Self-pay | Admitting: Family Medicine

## 2021-01-06 ENCOUNTER — Other Ambulatory Visit: Payer: Self-pay

## 2021-01-06 ENCOUNTER — Ambulatory Visit (HOSPITAL_COMMUNITY)
Admission: RE | Admit: 2021-01-06 | Discharge: 2021-01-06 | Disposition: A | Discharge status: Home / Self Care | Payer: Medicare Other | Source: Ambulatory Visit | Attending: Family Medicine | Admitting: Family Medicine

## 2021-01-06 VITALS — BP 125/81 | HR 73 | Temp 97.9°F | Ht 72.0 in | Wt 220.6 lb

## 2021-01-06 DIAGNOSIS — K5900 Constipation, unspecified: Secondary | ICD-10-CM

## 2021-01-06 NOTE — Telephone Encounter (Signed)
Pt is requesting an earlier app. Due to stomach issues

## 2021-01-06 NOTE — Progress Notes (Signed)
Patient ID: John Marquez, male    DOB: 1955/12/19, 65 y.o.   MRN: [id number]   Chief Complaint  Patient presents with   Abdominal Pain    Irregular bowels and bad gas pains for 3-4 weeks since being at beach- like a constipation   Subjective:    HPI F/u abd pain and constipation.  At beach recently and drinking beer, and felt constipation worsened from his diet and drinking on vacation when he returned.  Lots of gas and not drinking much. Not eating much. Waking up at night with gas pain. Sitting on toilet and not able to go bm. Last week- having loose stools and having urgency to go,  This week is different, however did take imodium. Gas pain up to chest and lower abs. Took imodium 1 x when loose. The next day took align probiotic for 3 days in a row. Trying 5-6x per day and once would have a bm "blow out." Last 2 days not having bm. Working out every day and usually going 2x per day daily.    No fever, no blood or black stool. Feeling better after bm, but still feeling not fully emptying. Colonoscopy in 11/21- normal. And return in 10 yrs. No diverticulosis on scope.    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   Outpatient Encounter Medications as of 01/06/2021  Medication Sig   cetirizine (ZYRTEC) 10 MG tablet Take 10 mg by mouth daily as needed for allergies.   DULoxetine (CYMBALTA) 60 MG capsule Take 1 capsule (60 mg total) by mouth daily.   rOPINIRole (REQUIP) 0.5 MG tablet Take 1 tablet (0.5 mg total) by mouth at bedtime as needed (restless legs).   tadalafil (CIALIS) 20 MG tablet Take 0.5-1 tablets (10-20 mg total) by mouth every other day as needed for erectile  dysfunction.   zolpidem (AMBIEN) 10 MG tablet Take 0.5-1 tablets (5-10 mg total) by mouth at bedtime.   No facility-administered encounter medications on file as of 01/06/2021.     Review of Systems  Constitutional:  Negative for chills and fever.  HENT:  Negative for congestion, rhinorrhea and sore throat.   Respiratory:  Negative for cough, shortness of breath and wheezing.   Cardiovascular:  Negative for chest pain and leg swelling.  Gastrointestinal:  Positive for abdominal pain and constipation. Negative for blood in stool, diarrhea, nausea and vomiting.  Genitourinary:  Negative for dysuria and frequency.  Skin:  Negative for rash.  Neurological:  Negative for dizziness, weakness and headaches.    Vitals BP 125/81   Pulse 73   Temp 97.9 F (36.6 C) (Oral)   Ht 6' (1.829 m)   Wt 220 lb 9.6 oz (100.1 kg)   SpO2 99%   BMI 29.92 kg/m   Objective:   Physical Exam Vitals and nursing note reviewed.  Constitutional:      General: He is not in acute distress.    Appearance: Normal appearance. He is not ill-appearing.  HENT:     Head: Normocephalic.     Nose: Nose normal. No congestion.     Mouth/Throat:     Mouth: Mucous membranes are moist.     Pharynx: No oropharyngeal exudate.  Eyes:     Extraocular Movements: Extraocular movements intact.  Conjunctiva/sclera: Conjunctivae normal.     Pupils: Pupils are equal, round, and reactive to light.  Cardiovascular:     Rate and Rhythm: Normal rate and regular rhythm.     Pulses: Normal pulses.     Heart sounds: Normal heart sounds. No murmur heard. Pulmonary:     Effort: Pulmonary effort is normal.     Breath sounds: Normal breath sounds. No wheezing, rhonchi or rales.  Abdominal:     General: Abdomen is flat. Bowel sounds are normal. There is no distension.     Palpations: Abdomen is soft. There is no mass.     Tenderness: There is generalized abdominal tenderness. There is no guarding or rebound. Negative signs  include Murphy's sign and McBurney's sign.     Hernia: No hernia is present. There is no hernia in the umbilical area.  Musculoskeletal:        General: Normal range of motion.     Right lower leg: No edema.     Left lower leg: No edema.  Skin:    General: Skin is warm and dry.     Findings: No rash.  Neurological:     General: No focal deficit present.     Mental Status: He is alert and oriented to person, place, and time.     Cranial Nerves: No cranial nerve deficit.  Psychiatric:        Mood and Affect: Mood normal.        Behavior: Behavior normal.        Thought Content: Thought content normal.        Judgment: Judgment normal.     Assessment and Plan   1. Constipation, unspecified constipation type - DG Abd 2 Views; Future   -gave info on constipation.  Miralax, dulcolax suppositories or using mag citrate.  Increase water intake and eat a light diet till having another bm. Call if worsening or not having bm in next 48 hrs.  Return if symptoms worsen or fail to improve.

## 2021-02-27 ENCOUNTER — Telehealth: Payer: Self-pay | Admitting: Family Medicine

## 2021-02-27 MED ORDER — DULOXETINE HCL 60 MG PO CPEP
60.0000 mg | ORAL_CAPSULE | Freq: Every day | ORAL | 0 refills | Status: DC
Start: 2021-02-27 — End: 2021-09-21

## 2021-02-27 NOTE — Telephone Encounter (Signed)
Patient is requesting refill on Cymbalta '60mg'$  [facility] [address].  CB# [phone]

## 2021-02-27 NOTE — Telephone Encounter (Signed)
Prescription sent electronically to pharmacy. Patient notified. 

## 2021-04-29 ENCOUNTER — Other Ambulatory Visit: Payer: Self-pay | Admitting: Family Medicine

## 2021-04-29 LAB — VITAMIN B12: Vitamin B-12: 326 pg/mL (ref 232–1245)

## 2021-09-20 ENCOUNTER — Other Ambulatory Visit: Payer: Self-pay | Admitting: Family Medicine

## 2021-12-17 ENCOUNTER — Other Ambulatory Visit: Payer: Self-pay | Admitting: Family Medicine

## 2022-02-03 ENCOUNTER — Encounter: Payer: Self-pay | Admitting: Family Medicine

## 2022-02-03 ENCOUNTER — Ambulatory Visit (INDEPENDENT_AMBULATORY_CARE_PROVIDER_SITE_OTHER): Payer: 59 | Admitting: Family Medicine

## 2022-02-03 VITALS — BP 112/70 | Ht 72.0 in | Wt 218.8 lb

## 2022-02-03 DIAGNOSIS — Z125 Encounter for screening for malignant neoplasm of prostate: Secondary | ICD-10-CM

## 2022-02-03 DIAGNOSIS — Z13220 Encounter for screening for lipoid disorders: Secondary | ICD-10-CM

## 2022-02-03 DIAGNOSIS — E538 Deficiency of other specified B group vitamins: Secondary | ICD-10-CM

## 2022-02-03 DIAGNOSIS — Z0000 Encounter for general adult medical examination without abnormal findings: Secondary | ICD-10-CM

## 2022-02-03 DIAGNOSIS — I2584 Coronary atherosclerosis due to calcified coronary lesion: Secondary | ICD-10-CM

## 2022-02-03 DIAGNOSIS — I2510 Atherosclerotic heart disease of native coronary artery without angina pectoris: Secondary | ICD-10-CM | POA: Insufficient documentation

## 2022-02-03 MED ORDER — MELOXICAM 15 MG PO TABS: 15.0000 mg | ORAL_TABLET | Freq: Every day | ORAL | 2 refills | Status: DC

## 2022-02-03 MED ORDER — DULOXETINE HCL 30 MG PO CPEP: 30.0000 mg | ORAL_CAPSULE | Freq: Every day | ORAL | 1 refills | Status: DC

## 2022-02-03 MED ORDER — TADALAFIL 20 MG PO TABS: 10.0000 mg | ORAL_TABLET | ORAL | 11 refills | Status: DC | PRN

## 2022-02-03 MED ORDER — ZOLPIDEM TARTRATE 10 MG PO TABS: 5.0000 mg | ORAL_TABLET | Freq: Every day | ORAL | 0 refills | Status: DC

## 2022-02-03 MED ORDER — ROSUVASTATIN CALCIUM 5 MG PO TABS: 5.0000 mg | ORAL_TABLET | Freq: Every day | ORAL | 1 refills | Status: DC

## 2022-02-03 NOTE — Progress Notes (Signed)
   Subjective:    Patient ID: John Marquez, male    DOB: 1955-08-06, 66 y.o.   MRN: [id number]  HPI AWV- Annual Wellness Visit  The patient was seen for their annual wellness visit. The patient's past medical history, surgical history, and family history were reviewed. Pertinent vaccines were reviewed ( tetanus, pneumonia, shingles, flu) The patient's medication list was reviewed and updated.  The height and weight were entered.  BMI recorded in electronic record elsewhere  Cognitive screening was completed. Outcome of Mini - Cog: Pass   Falls /depression screening electronically recorded within record elsewhere  Current tobacco usage:no (All patients who use tobacco were given written and verbal information on quitting)  Recent listing of emergency department/hospitalizations over the past year were reviewed.  current specialist the patient sees on a regular basis: has seen ortho a few times; dermatologist    Medicare annual wellness visit patient questionnaire was reviewed.  A written screening schedule for the patient for the next 5-10 years was given. Appropriate discussion of followup regarding next visit was discussed.   Well adult exam - Plan: Vitamin B12, Hepatic function panel, PSA  Vitamin B 12 deficiency - Plan: Vitamin B12, Hepatic function panel  Screening PSA (prostate specific antigen) - Plan: Vitamin B12, Hepatic function panel, PSA  Screening, lipid - Plan: Vitamin B12, Lipid panel, Hepatic function panel  Coronary artery calcification    Review of Systems     Objective:   Physical Exam  General-in no acute distress Eyes-no discharge Lungs-respiratory rate normal, CTA CV-no murmurs,RRR Extremities skin warm dry no edema Neuro grossly normal Behavior normal, alert  Neuropathy is noted in both legs and feet Prostate exam normal.     Assessment & Plan:  1. Well adult exam Adult wellness-complete.wellness physical was conducted today.  Importance of diet and exercise were discussed in detail.  Importance of stress reduction and healthy living were discussed.  In addition to this a discussion regarding safety was also covered.  We also reviewed over immunizations and gave recommendations regarding current immunization needed for age.   In addition to this additional areas were also touched on including: Preventative health exams needed:  Colonoscopy 2031  Patient was advised yearly wellness exam  - Vitamin B12 - Hepatic function panel - PSA  2. Vitamin B 12 deficiency Check B12 level - Vitamin B12 - Hepatic function panel  3. Screening PSA (prostate specific antigen) Check PSA - Vitamin B12 - Hepatic function panel - PSA  4. Screening, lipid Check lipid - Vitamin B12 - Lipid panel - Hepatic function panel  Coronary calcification seen on recent CAT scan that patient had due to asbestos exposure I counseled the patient that he should start statin we had a shared discussion he agreed We did offer coronary calcium score test as well as cardiology consult patient defers  Patient does have neuropathy related to his chemotherapy he is disabled.  He is currently on Cymbalta but he would like to try a lower dose We did discuss this I think that would be perfectly fine he will give [place] feedback Reduce it to 30 mg

## 2022-02-05 LAB — HEPATIC FUNCTION PANEL
ALT: 29 IU/L (ref 0–44)
AST: 21 IU/L (ref 0–40)
Albumin: 4.5 g/dL (ref 3.9–4.9)
Alkaline Phosphatase: 61 IU/L (ref 44–121)
Bilirubin Total: 0.6 mg/dL (ref 0.0–1.2)
Bilirubin, Direct: 0.15 mg/dL (ref 0.00–0.40)
Total Protein: 6.6 g/dL (ref 6.0–8.5)

## 2022-02-05 LAB — LIPID PANEL
Chol/HDL Ratio: 3.1 ratio (ref 0.0–5.0)
Cholesterol, Total: 133 mg/dL (ref 100–199)
HDL: 43 mg/dL (ref >39)
LDL Chol Calc (NIH): 78 mg/dL (ref 0–99)
Triglycerides: 57 mg/dL (ref 0–149)
VLDL Cholesterol Cal: 12 mg/dL (ref 5–40)

## 2022-02-05 LAB — PSA: Prostate Specific Ag, Serum: 0.9 ng/mL (ref 0.0–4.0)

## 2022-02-05 LAB — VITAMIN B12: Vitamin B-12: 270 pg/mL (ref 232–1245)

## 2022-02-09 NOTE — Addendum Note (Signed)
Addended by: Dairl Ponder on: 02/09/2022 10:25 AM   Modules accepted: Orders

## 2022-03-22 ENCOUNTER — Ambulatory Visit (HOSPITAL_COMMUNITY)
Admission: RE | Admit: 2022-03-22 | Discharge: 2022-03-22 | Disposition: A | Discharge status: Home / Self Care | Payer: 59 | Source: Ambulatory Visit | Attending: Family Medicine | Admitting: Family Medicine

## 2022-03-22 DIAGNOSIS — I2510 Atherosclerotic heart disease of native coronary artery without angina pectoris: Secondary | ICD-10-CM | POA: Insufficient documentation

## 2022-03-22 DIAGNOSIS — I2584 Coronary atherosclerosis due to calcified coronary lesion: Secondary | ICD-10-CM | POA: Insufficient documentation

## 2022-03-28 ENCOUNTER — Encounter: Payer: Self-pay | Admitting: Family Medicine

## 2022-03-29 ENCOUNTER — Other Ambulatory Visit: Payer: Self-pay | Admitting: Family Medicine

## 2022-03-30 NOTE — Telephone Encounter (Signed)
Nurses Please let John Marquez know that I am highly sympathetic to what is going on with him.  I would like to see him as soon as possible.  Should be able to fit him in Friday afternoon.  Or perhaps right before lunch on Friday.  Please work with my schedule in order to get him seen if this does not meet his needs we can shoot for Monday or Tuesday Thanks-Dr. Nicki Reaper  Obviously if John Marquez starts having any severe issues before then and needs help I would recommend behavioral health team through the ER or connecting with [place] thank you

## 2022-03-31 NOTE — Telephone Encounter (Signed)
Pt placed on schedule for Friday afternoon

## 2022-04-02 ENCOUNTER — Ambulatory Visit: Payer: Medicare Other | Admitting: Family Medicine

## 2022-04-02 ENCOUNTER — Ambulatory Visit (INDEPENDENT_AMBULATORY_CARE_PROVIDER_SITE_OTHER): Payer: Medicare Other | Admitting: Family Medicine

## 2022-04-02 ENCOUNTER — Encounter: Payer: Self-pay | Admitting: Family Medicine

## 2022-04-02 VITALS — BP 132/82 | Wt 212.8 lb

## 2022-04-02 DIAGNOSIS — F322 Major depressive disorder, single episode, severe without psychotic features: Secondary | ICD-10-CM | POA: Diagnosis not present

## 2022-04-02 DIAGNOSIS — G4700 Insomnia, unspecified: Secondary | ICD-10-CM

## 2022-04-02 DIAGNOSIS — R634 Abnormal weight loss: Secondary | ICD-10-CM

## 2022-04-02 DIAGNOSIS — F419 Anxiety disorder, unspecified: Secondary | ICD-10-CM

## 2022-04-02 DIAGNOSIS — I2510 Atherosclerotic heart disease of native coronary artery without angina pectoris: Secondary | ICD-10-CM

## 2022-04-02 DIAGNOSIS — I2584 Coronary atherosclerosis due to calcified coronary lesion: Secondary | ICD-10-CM | POA: Diagnosis not present

## 2022-04-02 MED ORDER — OLANZAPINE 2.5 MG PO TABS: 2.5000 mg | ORAL_TABLET | Freq: Every day | ORAL | 2 refills | Status: DC

## 2022-04-02 MED ORDER — DULOXETINE HCL 60 MG PO CPEP: 60.0000 mg | ORAL_CAPSULE | Freq: Every day | ORAL | 3 refills | Status: DC

## 2022-04-02 NOTE — Progress Notes (Signed)
Subjective:    Patient ID: John Marquez, male    DOB: Sep 24, 1955, 66 y.o.   MRN: [id number]  HPI Pt arrives to discuss mental health. Pt sent my chart message and is having anxiety and no interest in things he once liked to do Patient relates that after he was started on statin and had his Cymbalta reduced-please see last note-patient has had increased anxiety feeling somewhat down negative depressed he states he is not suicidal.  He states sometimes he thinks how people could hurt himself but he has no intention of hurting himself because he wants to be around for his wife and children and grandchildren.  Patient states he does want to get better.  He does not want to do counseling or psychiatry consult currently He states that he felt he was doing better on the 60 mg of Cymbalta.  He states it is very difficult for him to sleep.  Taking Ambien sleep for about 3 hours then when he wakes up he thinks about many different issues that are going on stress him.  He finds himself just not enjoying things finds himself feeling anxious and on edge.  Denies hallucinations denies being suicidal He has lost weight unintentionally.  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 I believe a short course of this would be reasonable 4 to 8 weeks Patient is not suicidal we did talk about the warning signs to watch for We also talked about keeping things safe around the house This also included telling  the patient to make sure firearms are locked and secured We also discussed how many suicidal attempts are impulsive Once again patient did state he is not thinking about hurting self States he does not want to hurt himself because he wants to be around for his wife and grandchildren We also discussed a crisis plan how to reach out through on call Or calling office immediately or going to emergency department for immediate behavioral health assessment  In addition to this also talked about the importance of reaching out immediately should he feel this way whether the he should feel this way and reach out to [place] or behavioral health or ER for immediate behavioral health evaluation  We will do the lab work we will communicate this with the patient We will also do weekly phone calls for the next several weeks till we see things improving  We did discuss ways to stay connected in trying to stay reasonably active to help as a distraction while we are waiting on things to improve Did discuss other ways of handling stress including going for walks, listening to music, talking to others, staying connected with his wife, doing mild activity with friends  Also discussed the importance of minimizing alcohol he states typically he has 1 beer a day but on the weekends he might have 3 I encouraged him to keep at 1 or less

## 2022-04-03 ENCOUNTER — Other Ambulatory Visit: Payer: Self-pay | Admitting: Family Medicine

## 2022-04-03 LAB — CBC WITH DIFFERENTIAL/PLATELET
Basophils Absolute: 0 10*3/uL (ref 0.0–0.2)
Basos: 1 %
EOS (ABSOLUTE): 0.2 10*3/uL (ref 0.0–0.4)
Eos: 3 %
Hematocrit: 45 % (ref 37.5–51.0)
Hemoglobin: 15.6 g/dL (ref 13.0–17.7)
Immature Grans (Abs): 0 10*3/uL (ref 0.0–0.1)
Immature Granulocytes: 1 %
Lymphocytes Absolute: 1.3 10*3/uL (ref 0.7–3.1)
Lymphs: 20 %
MCH: 30.2 pg (ref 26.6–33.0)
MCHC: 34.7 g/dL (ref 31.5–35.7)
MCV: 87 fL (ref 79–97)
Monocytes Absolute: 0.7 10*3/uL (ref 0.1–0.9)
Monocytes: 10 %
Neutrophils Absolute: 4.3 10*3/uL (ref 1.4–7.0)
Neutrophils: 65 %
Platelets: 305 10*3/uL (ref 150–450)
RBC: 5.16 x10E6/uL (ref 4.14–5.80)
RDW: 12.6 % (ref 11.6–15.4)
WBC: 6.5 10*3/uL (ref 3.4–10.8)

## 2022-04-03 LAB — BASIC METABOLIC PANEL
BUN/Creatinine Ratio: 19 (ref 10–24)
BUN: 19 mg/dL (ref 8–27)
CO2: 22 mmol/L (ref 20–29)
Calcium: 9.9 mg/dL (ref 8.6–10.2)
Chloride: 101 mmol/L (ref 96–106)
Creatinine, Ser: 0.99 mg/dL (ref 0.76–1.27)
Glucose: 100 mg/dL — ABNORMAL HIGH (ref 70–99)
Potassium: 4.1 mmol/L (ref 3.5–5.2)
Sodium: 144 mmol/L (ref 134–144)
eGFR: 85 mL/min/{1.73_m2} (ref >59)

## 2022-04-03 LAB — HEPATIC FUNCTION PANEL
ALT: 22 IU/L (ref 0–44)
AST: 17 IU/L (ref 0–40)
Albumin: 4.9 g/dL (ref 3.9–4.9)
Alkaline Phosphatase: 75 IU/L (ref 44–121)
Bilirubin Total: 0.2 mg/dL (ref 0.0–1.2)
Bilirubin, Direct: <0.1 mg/dL (ref 0.00–0.40)
Total Protein: 6.8 g/dL (ref 6.0–8.5)

## 2022-04-03 LAB — TSH: TSH: 1.69 u[IU]/mL (ref 0.450–4.500)

## 2022-04-03 LAB — T4, FREE: Free T4: 1.1 ng/dL (ref 0.82–1.77)

## 2022-04-03 MED ORDER — CLONAZEPAM 0.5 MG PO TABS: ORAL_TABLET | ORAL | 0 refills | Status: DC

## 2022-04-05 ENCOUNTER — Ambulatory Visit: Payer: Medicare Other | Admitting: Family Medicine

## 2022-04-24 ENCOUNTER — Other Ambulatory Visit: Payer: Self-pay | Admitting: Family Medicine

## 2022-04-26 ENCOUNTER — Telehealth: Payer: Self-pay | Admitting: Family Medicine

## 2022-04-26 NOTE — Telephone Encounter (Signed)
Placed call to patient see how he is doing

## 2022-05-03 ENCOUNTER — Other Ambulatory Visit: Payer: Self-pay | Admitting: Family Medicine

## 2022-05-06 ENCOUNTER — Other Ambulatory Visit: Payer: Self-pay | Admitting: Family Medicine

## 2022-05-18 ENCOUNTER — Other Ambulatory Visit: Payer: Self-pay | Admitting: Family Medicine

## 2022-05-18 NOTE — Telephone Encounter (Signed)
I spoke with the patient-patient states he is doing much better.  He is using his Cymbalta every day 60 mg he does uses Ambien at nighttime.  He is no longer taking Zyprexa.  States his moods are doing well exercising eating well staying active he will follow-up in February he will let us know if he is having any problems before then

## 2022-08-06 ENCOUNTER — Ambulatory Visit (INDEPENDENT_AMBULATORY_CARE_PROVIDER_SITE_OTHER): Payer: Medicare Other | Admitting: Family Medicine

## 2022-08-06 ENCOUNTER — Encounter: Payer: Self-pay | Admitting: Family Medicine

## 2022-08-06 VITALS — BP 138/86 | Wt 220.2 lb

## 2022-08-06 DIAGNOSIS — Z789 Other specified health status: Secondary | ICD-10-CM | POA: Insufficient documentation

## 2022-08-06 DIAGNOSIS — G4700 Insomnia, unspecified: Secondary | ICD-10-CM

## 2022-08-06 DIAGNOSIS — I2584 Coronary atherosclerosis due to calcified coronary lesion: Secondary | ICD-10-CM

## 2022-08-06 DIAGNOSIS — I2510 Atherosclerotic heart disease of native coronary artery without angina pectoris: Secondary | ICD-10-CM | POA: Diagnosis not present

## 2022-08-06 DIAGNOSIS — Z13220 Encounter for screening for lipoid disorders: Secondary | ICD-10-CM | POA: Diagnosis not present

## 2022-08-06 MED ORDER — DULOXETINE HCL 20 MG PO CPEP: ORAL_CAPSULE | ORAL | 3 refills | Status: DC

## 2022-08-06 MED ORDER — ZOLPIDEM TARTRATE 10 MG PO TABS: ORAL_TABLET | ORAL | 3 refills | Status: DC

## 2022-08-06 NOTE — Progress Notes (Signed)
   Subjective:    Patient ID: John Marquez, male    DOB: 09-20-1955, 67 y.o.   MRN: [id number]  HPI Very nice patient Here today for follow-up Denies any chest tightness pressure pain Had a CT scan recently showed coronary calcium This was seen on a previous scan His cholesterol has always looked good but he did not tolerate statins  Pt arrives for follow up. No questions/concerns at this time. Pt is currently only on Cymbalta, Ambien and on occasion Cialis. Doing well on medications.   Review of Systems     Objective:   Physical Exam General-in no acute distress Eyes-no discharge Lungs-respiratory rate normal, CTA CV-no murmurs,RRR Extremities skin warm dry no edema Neuro grossly normal Behavior normal, alert        Assessment & Plan:  1. Screening, lipid Lipid Goal is to get LDL at 50 or less Patient did not tolerate statins Had severe depression on statins Would not be advisable to go back on statin Around that same time his Cymbalta had been interrupted but depression is a potential side effect for a small number of individuals with statin and because of the severity of his depression I would not recommend trying a statin again - Lipid panel  2. Insomnia, unspecified type Ambien I have encouraged him and counseled him to cut it down to a half a tablet daily In the long run it would be wise for him to taper off of that medicine  3. Coronary artery calcification This is shown up on his CT scan he had for his lungs because of asbestos exposure Because of this it is my feeling that his LDL needs to be preferably 50 or less Cannot tolerate statins Repatha or Praluent would be option He does do intense exercise on a regular basis several days per week I think it would be wise for this patient to have a consultation of cardiologist to see whether or not they recommend a more specific CT of the coronary arteries with flow And also judge whether or not it would be wise  for him to be on Repatha or Praluent  He also has mild erectile dysfunction along with inability to climax with Cymbalta he is interested in going down on the dose because of his history of depression I would recommend reducing the dose gradually.  After shared discussion we went from 60 mg down to 20 mg and he will take 2 of them per day-therefore 40 mg daily  He will give [place] feedback within 6 to 8 weeks how that is going we will follow him up within that timeframe he will notify us if he feels things are getting worse in regards to his moods or having any other issues

## 2022-08-06 NOTE — Patient Instructions (Signed)
We will work on setting up cardiology consultation Their office will call you to set up the actual appointment I will see you back in approximately 10 weeks Sooner if any problems feel free to call [facility]

## 2022-08-10 LAB — LIPID PANEL
Chol/HDL Ratio: 2.4 ratio (ref 0.0–5.0)
Cholesterol, Total: 155 mg/dL (ref 100–199)
HDL: 65 mg/dL (ref >39)
LDL Chol Calc (NIH): 76 mg/dL (ref 0–99)
Triglycerides: 75 mg/dL (ref 0–149)
VLDL Cholesterol Cal: 14 mg/dL (ref 5–40)

## 2022-08-22 ENCOUNTER — Telehealth: Payer: Self-pay | Admitting: Family Medicine

## 2022-08-22 DIAGNOSIS — I2510 Atherosclerotic heart disease of native coronary artery without angina pectoris: Secondary | ICD-10-CM

## 2022-08-22 NOTE — Telephone Encounter (Signed)
Nurses Recently I had a consultation with the patient and at that time we discussed ways of trying to get his cholesterol down because of coronary calcifications.  I told the patient that I would communicate with cardiology.  I did communicate with cardiology.  They recommend trying Zetia 10 mg 1 tablet daily.  This medication is not a statin.  It helps block cholesterol absorption from the intestines.  Should be well-tolerated.  It would be reasonable to try.  If he is willing to add this to his regimen I would recommend 1 tablet daily, #90, 1 refill, we will do follow-up lab work later in the spring.  In my opinion it would be a good idea for him to be on this medicine to help lower his LDL preferably below 70 if possible

## 2022-08-23 MED ORDER — EZETIMIBE 10 MG PO TABS: 10.0000 mg | ORAL_TABLET | Freq: Every day | ORAL | 1 refills | Status: DC

## 2022-08-23 NOTE — Telephone Encounter (Signed)
Pt contacted. Pt verbalized understanding. [facility]. Referral to cardiology also placed due to no referral being in chart. (Will contact referral coordinator regarding getting appt)

## 2022-09-03 ENCOUNTER — Other Ambulatory Visit: Payer: Self-pay | Admitting: Family Medicine

## 2022-09-10 ENCOUNTER — Ambulatory Visit: Payer: 59 | Attending: Cardiovascular Disease | Admitting: Cardiovascular Disease

## 2022-09-10 ENCOUNTER — Encounter: Payer: Self-pay | Admitting: Cardiovascular Disease

## 2022-09-10 ENCOUNTER — Encounter: Payer: Self-pay | Admitting: Family Medicine

## 2022-09-10 VITALS — BP 130/82 | HR 60 | Ht 73.0 in | Wt 215.0 lb

## 2022-09-10 DIAGNOSIS — I2510 Atherosclerotic heart disease of native coronary artery without angina pectoris: Secondary | ICD-10-CM

## 2022-09-10 DIAGNOSIS — I2584 Coronary atherosclerosis due to calcified coronary lesion: Secondary | ICD-10-CM

## 2022-09-10 NOTE — Progress Notes (Signed)
Cardiology Office Note:    Date:  09/10/2022   ID:  TOU ROBUSTELLI, DOB Apr 13, 1956, MRN [id number]  PCP:  Kathyrn Drown, MD   [place] Providers Cardiologist:  None     Referring MD: Kathyrn Drown, MD   Chief Complaint  Patient presents with   Consult  John Marquez is a 67 y.o. male who is being seen today for the evaluation of reported coronary artery calcification at the request of [name], [name], MD.   History of Present Illness:    John Marquez is a 67 y.o. 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  "Mild coronary artery calcification" was reported on the CT of the chest performed at [facility] in January 2023.  I cannot review those images.  Multiple other CT scans performed over many years in [place] did not show any evidence of coronary calcification on my review.  A coronary calcium score CT performed in September 2023 actually scored 0.  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  He goes to the gym 5 days a week.  He spends about 10-15 minutes during cardio exercises and the rest of the hour lifting weights.  He has no problems of chest pain or shortness of breath during exercise.  He denies palpitations, dizziness, syncope, claudication.  He has mild erectile dysfunction.  He did not tolerate statin therapy.  He was prescribed ezetimibe monotherapy, but has not yet started taking this medicine, waiting for this appointment.  Lipid profile performed just a month ago (off medications) shows a very favorable pattern with cholesterol 155, HDL 65, LDL 76.  He has no history of clinically relevant CAD or PAD, stroke or TIA.  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[place]) 03/23/2016   Eczema    Winter   History of melanoma excision    07-23-2015 and 07-31-2015  mid upper back   Hydronephrosis, right    Restless legs syndrome (RLS) 08/21/2019   Retroperitoneal lymphadenopathy     Past Surgical History:  Procedure Laterality Date   COLONOSCOPY  2010   normal   COLONOSCOPY WITH PROPOFOL N/A 05/15/2020   Procedure: COLONOSCOPY WITH PROPOFOL;  Surgeon: Daneil Dolin, MD;  Location: [facility];  Service: Endoscopy;  Laterality: N/A;  9:30AM   CYSTOSCOPY WITH STENT PLACEMENT Right 04/02/2016   Procedure: CYSTOSCOPY WITH RIGHT RETROGRADE PYELOGRAM AND STENT PLACEMENT;  Surgeon: Bjorn Loser, MD;  Location: [facility];  Service: Urology;  Laterality: Right;    Current Medications: Current Meds  Medication Sig   DULoxetine (CYMBALTA) 20 MG capsule TAKE 2 TABS BY MOUTH DIALY   tadalafil (CIALIS) 20 MG tablet Take 0.5-1 tablets (10-20 mg total) by mouth every other day as needed for erectile dysfunction.   zolpidem (AMBIEN) 10 MG tablet TAKE 1/2 TO 1 TABLET (5-10 MG TOTAL) BY MOUTH EVERY DAY AT BEDTIME     Allergies:   Penicillins and Levaquin [levofloxacin]   Social History   Socioeconomic History   Marital status: Married    Spouse name: Not on file   Number of  children: Not on file   Years of education: Not on file   Highest education level: Not on file  Occupational History   Not on file  Tobacco Use   Smoking status: Never   Smokeless tobacco: Never  Substance and Sexual Activity   Alcohol use: Yes    Alcohol/week: 0.0 standard drinks of alcohol    Comment: 2 times a week   Drug use: No   Sexual activity: Yes  Other Topics Concern   Not on file  Social History Narrative   Not on file   Social Determinants of Health   Financial Resource Strain: Not on file  Food Insecurity: Not on file  Transportation Needs: Not on file   Physical Activity: Not on file  Stress: Not on file  Social Connections: Not on file     Family History: The patient's family history includes Cancer in his father; Dementia in his mother; Diabetes in his mother; Hypertension in his brother and father.  ROS:   Please see the history of present illness.     All other systems reviewed and are negative.  EKGs/Labs/Other Studies Reviewed:    The following studies were reviewed today: Coronary calcium score CT 03/24/2022 Multiple CT studies of the chest, abdomen and pelvis performed between 2016 and 2017  EKG:  EKG is ordered today.  The ekg ordered today demonstrates normal sinus rhythm, normal tracing  Recent Labs: 04/02/2022: ALT 22; BUN 19; Creatinine, Ser 0.99; Hemoglobin 15.6; Platelets 305; Potassium 4.1; Sodium 144; TSH 1.690  Recent Lipid Panel    Component Value Date/Time   CHOL 155 08/09/2022 0808   TRIG 75 08/09/2022 0808   HDL 65 08/09/2022 0808   CHOLHDL 2.4 08/09/2022 0808   CHOLHDL 2.6 04/24/2014 0800   VLDL 10 04/24/2014 0800   LDLCALC 76 08/09/2022 0808     Risk Assessment/Calculations:                Physical Exam:    VS:  BP 130/82 (BP Location: Left Arm, Patient Position: Sitting, Cuff Size: Large)   Pulse 60   Ht '6\' 1"'$  (1.854 m)   Wt 215 lb (97.5 kg)   SpO2 96%   BMI 28.37 kg/m     Wt Readings from Last 3 Encounters:  09/10/22 215 lb (97.5 kg)  08/06/22 220 lb 3.2 oz (99.9 kg)  04/02/22 212 lb 12.8 oz (96.5 kg)     GEN:, Mildly overweight, but also appears very fit; well nourished, well developed in no acute distress HEENT: Normal NECK: No JVD; No carotid bruits LYMPHATICS: No lymphadenopathy CARDIAC: RRR, no murmurs, rubs, gallops RESPIRATORY:  Clear to auscultation without rales, wheezing or rhonchi  ABDOMEN: Soft, non-tender, non-distended MUSCULOSKELETAL:  No edema; No deformity  SKIN: Warm and dry NEUROLOGIC:  Alert and oriented x 3 PSYCHIATRIC:  Normal affect   ASSESSMENT:     No diagnosis found. PLAN:    On review of his CT images over the years and in particular careful review of his coronary calcium score CT performed September 2023, I find no evidence of any atherosclerotic calcification in the distribution of the coronary arteries the aorta or the visible branches.  He is physically very active and has no cardiovascular complaints.  His ECG is completely normal.  Even without any medications, his current lipid profile is quite favorable with an excellent HDL of 65 and an LDL of only 76.  He does not really have any other coronary risk factors.  I encouraged  him to continue regular physical exercise including aerobic/cardio exercise, which has led to improvement in his lipid profile.  I do not think he needs any lipid lowering pharmacological therapy and I do not see a reason for routine cardiology follow-up at this time.  Will be glad to see him again if he develops any atrial cardiovascular issues.            Medication Adjustments/Labs and Tests Ordered: Current medicines are reviewed at length with the patient today.  Concerns regarding medicines are outlined above.  No orders of the defined types were placed in this encounter.  No orders of the defined types were placed in this encounter.   Patient Instructions  Medication Instructions:  No changes *If you need a refill on your cardiac medications before your next appointment, please call your pharmacy*  Follow-Up: At [facility], you and your health needs are our priority.  As part of our continuing mission to provide you with exceptional heart care, we have created designated Provider Care Teams.  These Care Teams include your primary Cardiologist (physician) and Advanced Practice Providers (APPs -  Physician Assistants and Nurse Practitioners) who all work together to provide you with the care you need, when you need it.  We recommend signing up for the patient portal called  "MyChart".  Sign up information is provided on this After Visit Summary.  MyChart is used to connect with patients for Virtual Visits (Telemedicine).  Patients are able to view lab/test results, encounter notes, upcoming appointments, etc.  Non-urgent messages can be sent to your provider as well.   To learn more about what you can do with MyChart, go to [email or web address].    Your next appointment:    Follow up as neede  Provider:   Dr Sallyanne Kuster     Signed, Sanda Klein, MD  09/10/2022 8:24 AM    [place]

## 2022-09-10 NOTE — Patient Instructions (Signed)
Medication Instructions:  No changes *If you need a refill on your cardiac medications before your next appointment, please call your pharmacy*  Follow-Up: At [facility], you and your health needs are our priority.  As part of our continuing mission to provide you with exceptional heart care, we have created designated Provider Care Teams.  These Care Teams include your primary Cardiologist (physician) and Advanced Practice Providers (APPs -  Physician Assistants and Nurse Practitioners) who all work together to provide you with the care you need, when you need it.  We recommend signing up for the patient portal called "MyChart".  Sign up information is provided on this After Visit Summary.  MyChart is used to connect with patients for Virtual Visits (Telemedicine).  Patients are able to view lab/test results, encounter notes, upcoming appointments, etc.  Non-urgent messages can be sent to your provider as well.   To learn more about what you can do with MyChart, go to [email or web address].    Your next appointment:    Follow up as neede  Provider:   Dr Sallyanne Kuster

## 2022-09-21 ENCOUNTER — Other Ambulatory Visit: Payer: Self-pay | Admitting: Family Medicine

## 2022-09-22 NOTE — Telephone Encounter (Signed)
There seems to be an error in this prescription.  Cymbalta the dosing appropriate is 20 mg or 40 mg or 60 mg  Previous prescriptions dated 2 capsules daily which is 40 mg This prescription is saying 2 tablets twice daily which is 80 mg which is too much Please clarify with patient that my understanding is he should be on 2 of these a day not 4  Please clarify then send me back a message

## 2022-10-15 ENCOUNTER — Ambulatory Visit: Payer: Medicare Other | Admitting: Family Medicine

## 2022-10-19 ENCOUNTER — Ambulatory Visit: Payer: 59 | Admitting: Family Medicine

## 2022-10-20 ENCOUNTER — Ambulatory Visit (INDEPENDENT_AMBULATORY_CARE_PROVIDER_SITE_OTHER): Payer: Medicare Other | Admitting: Family Medicine

## 2022-10-20 ENCOUNTER — Telehealth: Payer: Self-pay | Admitting: Family Medicine

## 2022-10-20 VITALS — BP 138/84 | HR 70 | Ht 73.0 in | Wt 215.2 lb

## 2022-10-20 DIAGNOSIS — Z125 Encounter for screening for malignant neoplasm of prostate: Secondary | ICD-10-CM

## 2022-10-20 DIAGNOSIS — T451X5A Adverse effect of antineoplastic and immunosuppressive drugs, initial encounter: Secondary | ICD-10-CM

## 2022-10-20 DIAGNOSIS — G4700 Insomnia, unspecified: Secondary | ICD-10-CM | POA: Diagnosis not present

## 2022-10-20 DIAGNOSIS — G620 Drug-induced polyneuropathy: Secondary | ICD-10-CM | POA: Diagnosis not present

## 2022-10-20 DIAGNOSIS — Z79899 Other long term (current) drug therapy: Secondary | ICD-10-CM

## 2022-10-20 MED ORDER — ZOLPIDEM TARTRATE 10 MG PO TABS: ORAL_TABLET | ORAL | 3 refills | Status: DC

## 2022-10-20 MED ORDER — DULOXETINE HCL 60 MG PO CPEP: ORAL_CAPSULE | ORAL | 2 refills | Status: DC

## 2022-10-20 NOTE — Progress Notes (Signed)
   Subjective:    Patient ID: John Marquez, male    DOB: 01-07-1956, 67 y.o.   MRN: [id number]  HPI Patient arrives today for 10 week follow up.  Pt eating healthy Pt had good cardiology visit Pt feels well, moods are doing well Exercises often Does stay active at home Insomnia issues on ambien    Review of Systems     Objective:   Physical Exam General-in no acute distress Eyes-no discharge Lungs-respiratory rate normal, CTA CV-no murmurs,RRR Extremities skin warm dry no edema Neuro grossly normal Behavior normal, alert BP was up when he came in then it was better       Assessment & Plan:  1. Insomnia, unspecified type Encouraged him to gradually reduce dose by cutting ambien Try 3/4 pill for 2 months then a little less after that with a goal of 1/2 tablet by wellness in August  2. Chemotherapy-induced neuropathy Stable but still present Unable to work  Wellness in august

## 2022-10-20 NOTE — Addendum Note (Signed)
Addended by: Lilyan Punt A on: 10/20/2022 08:27 PM   Modules accepted: Orders

## 2022-10-20 NOTE — Telephone Encounter (Signed)
Please order met 7 and psa for wellness in August Screening prostate cancer and high risk med (May use BPH to get PSA covered) He is aware no reason to call him

## 2022-10-22 NOTE — Telephone Encounter (Signed)
Blood  work ordered in EPIC. Patient aware. 

## 2022-12-24 ENCOUNTER — Ambulatory Visit (INDEPENDENT_AMBULATORY_CARE_PROVIDER_SITE_OTHER): Payer: 59

## 2022-12-24 VITALS — BP 138/84 | Ht 73.0 in | Wt 212.0 lb

## 2022-12-24 DIAGNOSIS — Z0000 Encounter for general adult medical examination without abnormal findings: Secondary | ICD-10-CM

## 2022-12-24 NOTE — Patient Instructions (Signed)
John Marquez , Thank you for taking time to come for your Medicare Wellness Visit. I appreciate your ongoing commitment to your health goals. Please review the following plan we discussed and let me know if I can assist you in the future.   These are the goals we discussed:  Goals      Patient Stated     Stay as healthy as he currently is and spend more time with family         This is a list of the screening recommended for you and due dates:  Health Maintenance  Topic Date Due   Pneumonia Vaccine (1 of 2 - PCV) Never done   COVID-19 Vaccine (3 - Pfizer risk series) 11/22/2019   Flu Shot  02/03/2023   Medicare Annual Wellness Visit  12/24/2023   DTaP/Tdap/Td vaccine (3 - Td or Tdap) 05/12/2027   Colon Cancer Screening  05/15/2030   Hepatitis C Screening  Completed   HIV Screening  Completed   Zoster (Shingles) Vaccine  Completed   HPV Vaccine  Aged Out    Advanced directives: Advance directive discussed with you today. Even though you declined this today, please call our office should you change your mind, and we can give you the proper paperwork for you to fill out. Advance care planning is a way to make decisions about medical care that fits your values in case you are ever unable to make these decisions for yourself.  Information on Advanced Care Planning can be found at [facility] Advance Health Care Directives Advance Health Care Directives ([email or web address]    Conditions/risks identified: You are due for the following vaccines: Covid Booster, Pneumonia You can have these done at your preferred pharmacy. Please have them send [place] documentation of the vaccines given so that we can update your chart.   Next appointment: VIRTUAL/ TELEPHONE VISIT Follow up in one year for your annual wellness visit  December 30, 2023 at 8:30am   Preventive Care 33 Years and Older, Male  Preventive care refers to lifestyle choices and visits with your health care provider that can  promote health and wellness. What does preventive care include? A yearly physical exam. This is also called an annual well check. Dental exams once or twice a year. Routine eye exams. Ask your health care provider how often you should have your eyes checked. Personal lifestyle choices, including: Daily care of your teeth and gums. Regular physical activity. Eating a healthy diet. Avoiding tobacco and drug use. Limiting alcohol use. Practicing safe sex. Taking low doses of aspirin every day. Taking vitamin and mineral supplements as recommended by your health care provider. What happens during an annual well check? The services and screenings done by your health care provider during your annual well check will depend on your age, overall health, lifestyle risk factors, and family history of disease. Counseling  Your health care provider may ask you questions about your: Alcohol use. Tobacco use. Drug use. Emotional well-being. Home and relationship well-being. Sexual activity. Eating habits. History of falls. Memory and ability to understand (cognition). Work and work Astronomer. Screening  You may have the following tests or measurements: Height, weight, and BMI. Blood pressure. Lipid and cholesterol levels. These may be checked every 5 years, or more frequently if you are over 37 years old. Skin check. Lung cancer screening. You may have this screening every year starting at age 89 if you have a 30-pack-year history of smoking and currently smoke or  have quit within the past 15 years. Fecal occult blood test (FOBT) of the stool. You may have this test every year starting at age 59. Flexible sigmoidoscopy or colonoscopy. You may have a sigmoidoscopy every 5 years or a colonoscopy every 10 years starting at age 64. Prostate cancer screening. Recommendations will vary depending on your family history and other risks. Hepatitis C blood test. Hepatitis B blood test. Sexually  transmitted disease (STD) testing. Diabetes screening. This is done by checking your blood sugar (glucose) after you have not eaten for a while (fasting). You may have this done every 1-3 years. Abdominal aortic aneurysm (AAA) screening. You may need this if you are a current or former smoker. Osteoporosis. You may be screened starting at age 26 if you are at high risk. Talk with your health care provider about your test results, treatment options, and if necessary, the need for more tests. Vaccines  Your health care provider may recommend certain vaccines, such as: Influenza vaccine. This is recommended every year. Tetanus, diphtheria, and acellular pertussis (Tdap, Td) vaccine. You may need a Td booster every 10 years. Zoster vaccine. You may need this after age 66. Pneumococcal 13-valent conjugate (PCV13) vaccine. One dose is recommended after age 47. Pneumococcal polysaccharide (PPSV23) vaccine. One dose is recommended after age 34. Talk to your health care provider about which screenings and vaccines you need and how often you need them. This information is not intended to replace advice given to you by your health care provider. Make sure you discuss any questions you have with your health care provider. Document Released: 07/18/2015 Document Revised: 03/10/2016 Document Reviewed: 04/22/2015 Elsevier Interactive Patient Education  2017 [employer].  Fall Prevention in the Home Falls can cause injuries. They can happen to people of all ages. There are many things you can do to make your home safe and to help prevent falls. What can I do on the outside of my home? Regularly fix the edges of walkways and driveways and fix any cracks. Remove anything that might make you trip as you walk through a door, such as a raised step or threshold. Trim any bushes or trees on the path to your home. Use bright outdoor lighting. Clear any walking paths of anything that might make someone trip, such as  rocks or tools. Regularly check to see if handrails are loose or broken. Make sure that both sides of any steps have handrails. Any raised decks and porches should have guardrails on the edges. Have any leaves, snow, or ice cleared regularly. Use sand or salt on walking paths during winter. Clean up any spills in your garage right away. This includes oil or grease spills. What can I do in the bathroom? Use night lights. Install grab bars by the toilet and in the tub and shower. Do not use towel bars as grab bars. Use non-skid mats or decals in the tub or shower. If you need to sit down in the shower, use a plastic, non-slip stool. Keep the floor dry. Clean up any water that spills on the floor as soon as it happens. Remove soap buildup in the tub or shower regularly. Attach bath mats securely with double-sided non-slip rug tape. Do not have throw rugs and other things on the floor that can make you trip. What can I do in the bedroom? Use night lights. Make sure that you have a light by your bed that is easy to reach. Do not use any sheets or blankets that  are too big for your bed. They should not hang down onto the floor. Have a firm chair that has side arms. You can use this for support while you get dressed. Do not have throw rugs and other things on the floor that can make you trip. What can I do in the kitchen? Clean up any spills right away. Avoid walking on wet floors. Keep items that you use a lot in easy-to-reach places. If you need to reach something above you, use a strong step stool that has a grab bar. Keep electrical cords out of the way. Do not use floor polish or wax that makes floors slippery. If you must use wax, use non-skid floor wax. Do not have throw rugs and other things on the floor that can make you trip. What can I do with my stairs? Do not leave any items on the stairs. Make sure that there are handrails on both sides of the stairs and use them. Fix handrails  that are broken or loose. Make sure that handrails are as long as the stairways. Check any carpeting to make sure that it is firmly attached to the stairs. Fix any carpet that is loose or worn. Avoid having throw rugs at the top or bottom of the stairs. If you do have throw rugs, attach them to the floor with carpet tape. Make sure that you have a light switch at the top of the stairs and the bottom of the stairs. If you do not have them, ask someone to add them for you. What else can I do to help prevent falls? Wear shoes that: Do not have high heels. Have rubber bottoms. Are comfortable and fit you well. Are closed at the toe. Do not wear sandals. If you use a stepladder: Make sure that it is fully opened. Do not climb a closed stepladder. Make sure that both sides of the stepladder are locked into place. Ask someone to hold it for you, if possible. Clearly mark and make sure that you can see: Any grab bars or handrails. First and last steps. Where the edge of each step is. Use tools that help you move around (mobility aids) if they are needed. These include: Canes. Walkers. Scooters. Crutches. Turn on the lights when you go into a dark area. Replace any light bulbs as soon as they burn out. Set up your furniture so you have a clear path. Avoid moving your furniture around. If any of your floors are uneven, fix them. If there are any pets around you, be aware of where they are. Review your medicines with your doctor. Some medicines can make you feel dizzy. This can increase your chance of falling. Ask your doctor what other things that you can do to help prevent falls. This information is not intended to replace advice given to you by your health care provider. Make sure you discuss any questions you have with your health care provider. Document Released: 04/17/2009 Document Revised: 11/27/2015 Document Reviewed: 07/26/2014 Elsevier Interactive Patient Education  2017 [employer].

## 2022-12-24 NOTE — Progress Notes (Addendum)
 Please co-sign in Dr. Lorin Picket [name]'s absence  Subjective:   John Marquez is a 67 y.o. male who presents for an Initial Medicare Annual Wellness Visit.  Visit Complete: Virtual  I connected with  Amanda Cockayne on 12/24/22 by a audio enabled telemedicine application and verified that I am speaking with the correct person using two identifiers.  Patient Location: Home  Provider Location: Home Office  I discussed the limitations of evaluation and management by telemedicine. The patient expressed understanding and agreed to proceed.  Patient Medicare AWV questionnaire was completed by the patient on n/a; I have confirmed that all information answered by patient is correct and no changes since this date.  Review of Systems     Cardiac Risk Factors include: advanced age (>83men, >41 women);male gender     Objective:    Today's Vitals   12/24/22 0830  BP: 138/84  Weight: 212 lb (96.2 kg)  Height: 6\' 1"  (1.854 m)   Body mass index is 27.97 kg/m.     12/24/2022    8:43 AM 09/04/2020    3:16 PM 05/15/2020    7:51 AM 07/06/2016   10:13 AM 04/30/2016    1:56 PM 04/14/2016    7:37 AM 04/07/2016    7:44 AM  Advanced Directives  Does Patient Have a Medical Advance Directive? No No No Yes Yes Yes Yes  Type of Theme park manager;Living will Healthcare Power of [place];Living will Healthcare Power of [place];Living will Healthcare Power of [place];Living will  Does patient want to make changes to medical advance directive?    No - Patient declined No - Patient declined  No - Patient declined  Copy of Healthcare Power of Attorney in Chart?    No - copy requested No - copy requested No - copy requested No - copy requested  Would patient like information on creating a medical advance directive? No - Patient declined No - Patient declined         Current Medications (verified) Outpatient Encounter Medications as of 12/24/2022  Medication Sig   DULoxetine  (CYMBALTA) 60 MG capsule TAKE 1 TABS BY MOUTH DIALY   zolpidem (AMBIEN) 10 MG tablet TAKE 1/2 TO 1 TABLET (5-10 MG TOTAL) BY MOUTH EVERY DAY AT BEDTIME   tadalafil (CIALIS) 20 MG tablet Take 0.5-1 tablets (10-20 mg total) by mouth every other day as needed for erectile dysfunction.   No facility-administered encounter medications on file as of 12/24/2022.    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   Eczema    Winter   History of melanoma excision    07-23-2015 and 07-31-2015  mid upper back   Hydronephrosis, right    Restless legs syndrome (RLS) 08/21/2019   Retroperitoneal lymphadenopathy    Past Surgical History:  Procedure Laterality Date   COLONOSCOPY  2010   normal   COLONOSCOPY WITH PROPOFOL N/A 05/15/2020   Procedure: COLONOSCOPY WITH PROPOFOL;  Surgeon: Corbin Ade, MD;  Location: [facility];  Service: Endoscopy;  Laterality: N/A;  9:30AM   CYSTOSCOPY WITH STENT PLACEMENT Right 04/02/2016   Procedure: CYSTOSCOPY WITH RIGHT RETROGRADE PYELOGRAM AND STENT PLACEMENT;  Surgeon: Alfredo Martinez, MD;  Location: [facility];  Service: Urology;  Laterality: Right;   Family History  Problem Relation Age of Onset   Hypertension Father    Cancer Father        lung   Dementia Mother    Diabetes Mother    Hypertension Brother    Social History   Socioeconomic History   Marital status: Married    Spouse name: Not on file   Number of children: Not on file   Years of education: Not on file   Highest education level: Associate degree: occupational, Scientist, product/process development, or vocational program  Occupational History   Not on file  Tobacco Use   Smoking status: Never   Smokeless tobacco: Never  Substance and Sexual Activity   Alcohol use: Yes     Alcohol/week: 0.0 standard drinks of alcohol    Comment: 2 times a week   Drug use: No   Sexual activity: Yes  Other Topics Concern   Not on file  Social History Narrative   Not on file   Social Determinants of Health   Financial Resource Strain: Low Risk  (12/24/2022)   Overall Financial Resource Strain (CARDIA)    Difficulty of Paying Living Expenses: Not hard at all  Food Insecurity: No Food Insecurity (12/24/2022)   Hunger Vital Sign    Worried About Running Out of Food in the Last Year: Never true    Ran Out of Food in the Last Year: Never true  Transportation Needs: No Transportation Needs (12/24/2022)   PRAPARE - Administrator, Civil Service (Medical): No    Lack of Transportation (Non-Medical): No  Physical Activity: Sufficiently Active (12/24/2022)   Exercise Vital Sign    Days of Exercise per Week: 7 days    Minutes of Exercise per Session: 30 min  Stress: No Stress Concern Present (12/24/2022)   [employer] of Occupational Health - Occupational Stress Questionnaire    Feeling of Stress : Not at all  Recent Concern: Stress - Stress Concern Present (10/18/2022)   [place] [employer] of Occupational Health - Occupational Stress Questionnaire    Feeling of Stress : Very much  Social Connections: Moderately Isolated (12/24/2022)   Social Connection and Isolation Panel [NHANES]    Frequency of Communication with Friends and Family: More than three times a week    Frequency of Social Gatherings with Friends and Family: More than three times a week    Attends Religious Services: Never    Database administrator or Organizations: No    Attends Engineer, structural: Never    Marital Status: Married    Tobacco Counseling Counseling given: Yes   Clinical Intake:  Pre-visit preparation completed: Yes  Pain : No/denies pain     BMI - recorded: 27.97 Nutritional Status: BMI 25 -29 Overweight Nutritional Risks: None Diabetes: No  How often do you  need to have someone help you when you read instructions, pamphlets, or other written materials from your doctor or pharmacy?: 1 - Never  Interpreter Needed?: No  Information entered by ::  Torah Pinnock, CMA   Activities of Daily Living    12/24/2022    8:42 AM  In your present state of health, do you have any difficulty performing the following activities:  Hearing? 0  Vision? 0  Difficulty concentrating or making decisions? 0  Walking or climbing stairs? 0  Dressing or bathing? 0  Doing errands, shopping? 0  Preparing Food and eating ? N  Using the Toilet? N  In the past six months, have  you accidently leaked urine? N  Do you have problems with loss of bowel control? N  Managing your Medications? N  Managing your Finances? N  Housekeeping or managing your Housekeeping? N    Patient Care Team: Babs Sciara, MD as PCP - General (Family Medicine)  Indicate any recent Medical Services you may have received from other than [facility] providers in the past year (date may be approximate).     Assessment:   This is a routine wellness examination for Toma.  Hearing/Vision screen Hearing Screening - Comments:: Patient wears hearing aids  Vision Screening - Comments:: Wears rx glasses - up to date with routine eye exams    Dietary issues and exercise activities discussed:     Goals Addressed             This Visit's Progress    Patient Stated       Stay as healthy as he currently is and spend more time with family        Depression Screen    12/24/2022    8:36 AM 10/20/2022    3:58 PM 08/06/2022   10:44 AM 04/02/2022    4:50 PM 02/03/2022    9:11 AM 01/06/2021   11:04 AM 10/27/2020   10:36 AM  PHQ 2/9 Scores  PHQ - 2 Score 0 0 0 6 0 0 0  PHQ- 9 Score  0 1 17 0      Fall Risk    12/24/2022    8:36 AM 10/20/2022    3:58 PM 08/06/2022   10:43 AM 02/03/2022    9:08 AM 10/27/2020   10:36 AM  Fall Risk   Falls in the past year? 0 0 0 0 0  Number falls in past yr: 0 0 0  0 0  Injury with Fall? 0 0 0 0 0  Risk for fall due to : No Fall Risks  No Fall Risks No Fall Risks No Fall Risks  Follow up Falls prevention discussed  Falls evaluation completed Falls evaluation completed Falls evaluation completed    MEDICARE RISK AT HOME:  Medicare Risk at Home - 12/24/22 0835     Any stairs in or around the home? Yes    If so, are there any without handrails? No    Home free of loose throw rugs in walkways, pet beds, electrical cords, etc? Yes    Adequate lighting in your home to reduce risk of falls? Yes    Life alert? No    Use of a cane, walker or w/c? No    Grab bars in the bathroom? No    Shower chair or bench in shower? No    Elevated toilet seat or a handicapped toilet? No             TIMED UP AND GO:  Was the test performed? No    Cognitive Function:        12/24/2022    8:42 AM  6CIT Screen  What Year? 0 points  What month? 0 points  What time? 0 points  Count back from 20 0 points  Months in reverse 0 points  Repeat phrase 0 points  Total Score 0 points    Immunizations Immunization History  Administered Date(s) Administered   DT (Pediatric) 05/14/2013   Influenza,inj,Quad PF,6+ Mos 05/22/2014, 04/22/2015, 03/30/2016, 05/11/2017, 04/11/2018, 03/16/2019, 05/13/2020   Influenza-Unspecified 05/11/2017   PFIZER(Purple Top)SARS-COV-2 Vaccination 10/04/2019, 10/25/2019   Tdap 05/11/2017   Zoster Recombinat (Shingrix) 07/31/2018, 03/04/2019  TDAP status: Up to date  Flu Vaccine status: Up to date  Pneumococcal vaccine status: Due, Education has been provided regarding the importance of this vaccine. Advised may receive this vaccine at local pharmacy or Health Dept. Aware to provide a copy of the vaccination record if obtained from local pharmacy or Health Dept. Verbalized acceptance and understanding.  Covid-19 vaccine status: Information provided on how to obtain vaccines.   Qualifies for Shingles Vaccine? Yes   Zostavax  completed Yes   Shingrix Completed?: Yes  Screening Tests Health Maintenance  Topic Date Due   Pneumonia Vaccine 92+ Years old (1 of 2 - PCV) Never done   COVID-19 Vaccine (3 - Pfizer risk series) 11/22/2019   INFLUENZA VACCINE  02/03/2023   Medicare Annual Wellness (AWV)  12/24/2023   DTaP/Tdap/Td (3 - Td or Tdap) 05/12/2027   Colonoscopy  05/15/2030   Hepatitis C Screening  Completed   HIV Screening  Completed   Zoster Vaccines- Shingrix  Completed   HPV VACCINES  Aged Out    Health Maintenance  Health Maintenance Due  Topic Date Due   Pneumonia Vaccine 1+ Years old (1 of 2 - PCV) Never done   COVID-19 Vaccine (3 - Pfizer risk series) 11/22/2019    Colorectal cancer screening: Type of screening: Colonoscopy. Completed 08/22/2019. Repeat every 10 years  Lung Cancer Screening: (Low Dose CT Chest recommended if Age 22-80 years, 20 pack-year currently smoking OR have quit w/in 15years.) does not qualify.   Lung Cancer Screening Referral: n/a  Additional Screening:  Hepatitis C Screening: does not qualify; Completed 08/22/19  Vision Screening: Recommended annual ophthalmology exams for early detection of glaucoma and other disorders of the eye. Is the patient up to date with their annual eye exam?  Yes  Who is the provider or what is the name of the office in which the patient attends annual eye exams? Doesn't recall the providers name If pt is not established with a provider, would they like to be referred to a provider to establish care? No .   Dental Screening: Recommended annual dental exams for proper oral hygiene  Diabetic Foot Exam: N/A  Community Resource Referral / Chronic Care Management: CRR required this visit?  No   CCM required this visit?  No    Plan:     I have personally reviewed and noted the following in the patient's chart:   Medical and social history Use of alcohol, tobacco or illicit drugs  Current medications and supplements including  opioid prescriptions. Patient is not currently taking opioid prescriptions. Functional ability and status Nutritional status Physical activity Advanced directives List of other physicians Hospitalizations, surgeries, and ER visits in previous 12 months Vitals Screenings to include cognitive, depression, and falls Referrals and appointments  In addition, I have reviewed and discussed with patient certain preventive protocols, quality metrics, and best practice recommendations. A written personalized care plan for preventive services as well as general preventive health recommendations were provided to patient.   Because this visit was a virtual/telehealth visit,  certain criteria was not obtained, such a blood pressure, CBG if patient is a diabetic, and timed up and go.    Jordan Hawks Kimani Bedoya, CMA   12/24/2022   After Visit Summary: (MyChart) Due to this being a telephonic visit, the after visit summary with patients personalized plan was offered to patient via MyChart   Nurse Notes: health maintenance is UTD

## 2023-05-11 ENCOUNTER — Other Ambulatory Visit: Payer: Self-pay | Admitting: Family Medicine

## 2023-09-04 ENCOUNTER — Other Ambulatory Visit: Payer: Self-pay | Admitting: Family Medicine

## 2023-11-02 ENCOUNTER — Other Ambulatory Visit: Payer: Self-pay | Admitting: Family Medicine

## 2023-11-09 ENCOUNTER — Ambulatory Visit (INDEPENDENT_AMBULATORY_CARE_PROVIDER_SITE_OTHER): Admitting: Family Medicine

## 2023-11-09 ENCOUNTER — Encounter: Payer: Self-pay | Admitting: Family Medicine

## 2023-11-09 VITALS — BP 150/94 | HR 76 | Temp 98.6°F | Ht 73.0 in | Wt 222.0 lb

## 2023-11-09 DIAGNOSIS — C8333 Diffuse large B-cell lymphoma, intra-abdominal lymph nodes: Secondary | ICD-10-CM | POA: Diagnosis not present

## 2023-11-09 DIAGNOSIS — G620 Drug-induced polyneuropathy: Secondary | ICD-10-CM | POA: Diagnosis not present

## 2023-11-09 DIAGNOSIS — R030 Elevated blood-pressure reading, without diagnosis of hypertension: Secondary | ICD-10-CM | POA: Diagnosis not present

## 2023-11-09 DIAGNOSIS — Z0001 Encounter for general adult medical examination with abnormal findings: Secondary | ICD-10-CM

## 2023-11-09 DIAGNOSIS — Z0000 Encounter for general adult medical examination without abnormal findings: Secondary | ICD-10-CM

## 2023-11-09 DIAGNOSIS — Z125 Encounter for screening for malignant neoplasm of prostate: Secondary | ICD-10-CM

## 2023-11-09 DIAGNOSIS — Z13220 Encounter for screening for lipoid disorders: Secondary | ICD-10-CM

## 2023-11-09 MED ORDER — TADALAFIL 20 MG PO TABS
10.0000 mg | ORAL_TABLET | ORAL | 11 refills | Status: DC | PRN
Start: 2023-11-09 — End: 2024-03-08

## 2023-11-09 NOTE — Progress Notes (Signed)
   Subjective:    Patient ID: John Marquez, male    DOB: 06-Oct-1955, 68 y.o.   MRN: [id number]  HPI The patient comes in today for a wellness visit.    A review of their health history was completed.  A review of medications was also completed.  Any needed refills; refills given  Eating habits: Overall healthy eating habits  Falls/  MVA accidents in past few months: No falls or injuries recently  Regular exercise: Does fit in regular exercise does elliptical as well as working at [employer] pt sees on regular basis: Oncologist at [facility] for lymphoma from over 8 years ago  Preventative health issues were discussed.   Additional concerns: No current concerns  Has stable neuropathy Taking his medicine Patient has noticed blood pressure has been elevated recently  Recent lab work from his hematologist was reviewed as well as his notes  Review of Systems     Objective:   Physical Exam  General-in no acute distress Eyes-no discharge Lungs-respiratory rate normal, CTA CV-no murmurs,RRR Extremities skin warm dry no edema Neuro grossly normal Behavior normal, alert  Prostate exam is normal it is soft no nodules     Assessment & Plan:  Will do lab work near future 1. Well adult exam (Primary) Adult wellness-complete.wellness physical was conducted today. Importance of diet and exercise were discussed in detail.  Importance of stress reduction and healthy living were discussed.  In addition to this a discussion regarding safety was also covered.  We also reviewed over immunizations and gave recommendations regarding current immunization needed for age.   In addition to this additional areas were also touched on including: Preventative health exams needed:  Colonoscopy 2021  Patient was advised yearly wellness exam   2. Screening, lipid Screening - Lipid panel  3. Screening PSA (prostate specific antigen) Screening - PSA  4. Elevated blood pressure  reading Very important for him to maintain a healthy diet information regarding DASH diet given in addition to this increase cardio exercise follow-up 3 months to recheck blood pressure  5. Diffuse large B-cell lymphoma of intra-abdominal lymph nodes (HCC) Under good care with his oncologist in remission  6. Chemotherapy-induced neuropathy (HCC) Patient uses Cymbalta  doing very well  Stress levels doing well not depressed not anxious

## 2023-11-17 LAB — LIPID PANEL
Chol/HDL Ratio: 2.7 ratio (ref 0.0–5.0)
Cholesterol, Total: 152 mg/dL (ref 100–199)
HDL: 56 mg/dL (ref >39)
LDL Chol Calc (NIH): 82 mg/dL (ref 0–99)
Triglycerides: 74 mg/dL (ref 0–149)
VLDL Cholesterol Cal: 14 mg/dL (ref 5–40)

## 2023-11-17 LAB — PSA: Prostate Specific Ag, Serum: 1.5 ng/mL (ref 0.0–4.0)

## 2023-11-21 ENCOUNTER — Ambulatory Visit: Payer: Self-pay | Admitting: Family Medicine

## 2024-01-27 ENCOUNTER — Ambulatory Visit

## 2024-01-27 VITALS — Ht 73.0 in | Wt 222.0 lb

## 2024-01-27 DIAGNOSIS — Z0000 Encounter for general adult medical examination without abnormal findings: Secondary | ICD-10-CM | POA: Diagnosis not present

## 2024-01-27 NOTE — Progress Notes (Signed)
 Subjective:   John Marquez is a 68 y.o. who presents for a Medicare Wellness preventive visit.  As a reminder, Annual Wellness Visits don't include a physical exam, and some assessments may be limited, especially if this visit is performed virtually. We may recommend an in-person follow-up visit with your provider if needed.  Visit Complete: Virtual I connected with  [name] on 01/27/24 by a audio enabled telemedicine application and verified that I am speaking with the correct person using two identifiers.  Patient Location: Home  Provider Location: Home Office  I discussed the limitations of evaluation and management by telemedicine. The patient expressed understanding and agreed to proceed.  Vital Signs: Because this visit was a virtual/telehealth visit, some criteria may be missing or patient reported. Any vitals not documented were not able to be obtained and vitals that have been documented are patient reported.  VideoDeclined- This patient declined Librarian, academic. Therefore the visit was completed with audio only.  Persons Participating in Visit: Patient.  AWV Questionnaire: No: Patient Medicare AWV questionnaire was not completed prior to this visit.  Cardiac Risk Factors include: advanced age (>4men, >63 women);male gender     Objective:    Today's Vitals   01/27/24 0844  Weight: 222 lb (100.7 kg)  Height: 6' 1 (1.854 m)   Body mass index is 29.29 kg/m.     01/27/2024    8:54 AM 12/24/2022    8:43 AM 09/04/2020    3:16 PM 05/15/2020    7:51 AM 07/06/2016   10:13 AM 04/30/2016    1:56 PM 04/14/2016    7:37 AM  Advanced Directives  Does Patient Have a Medical Advance Directive? No No No No Yes  Yes  Yes   Type of Agricultural consultant;Living will Healthcare Power of [place];Living will  Healthcare Power of [place];Living will   Does patient want to make changes to medical advance directive?     No  - Patient declined  No - Patient declined    Copy of Healthcare Power of Attorney in Chart?     No - copy requested  No - copy requested  No - copy requested   Would patient like information on creating a medical advance directive? Yes (MAU/Ambulatory/Procedural Areas - Information given) No - Patient declined No - Patient declined         Data saved with a previous flowsheet row definition    Current Medications (verified) Outpatient Encounter Medications as of 01/27/2024  Medication Sig   DULoxetine  (CYMBALTA ) 60 MG capsule TAKE 1 TABS BY MOUTH DIALY   tadalafil  (CIALIS ) 20 MG tablet Take 0.5-1 tablets (10-20 mg total) by mouth every other day as needed for erectile dysfunction.   zolpidem  (AMBIEN ) 10 MG tablet TAKE HALF TO 1 TABLET BY MOUTH EVERY DAY AT BEDTIME   No facility-administered encounter medications on file as of 01/27/2024.    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   Eczema    Winter   History of melanoma excision    07-23-2015 and 07-31-2015  mid upper back   Hydronephrosis, right    Restless legs syndrome (RLS) 08/21/2019   Retroperitoneal lymphadenopathy    Past Surgical  History:  Procedure Laterality Date   COLONOSCOPY  2010   normal   COLONOSCOPY WITH PROPOFOL  N/A 05/15/2020   Procedure: COLONOSCOPY WITH PROPOFOL ;  Surgeon: Shaaron Lamar HERO, MD;  Location: [facility];  Service: Endoscopy;  Laterality: N/A;  9:30AM   CYSTOSCOPY WITH STENT PLACEMENT Right 04/02/2016   Procedure: CYSTOSCOPY WITH RIGHT RETROGRADE PYELOGRAM AND STENT PLACEMENT;  Surgeon: Glendia Elizabeth, MD;  Location: [facility];  Service: Urology;  Laterality: Right;   Family History  Problem Relation Age of Onset   Hypertension Father    Cancer Father         lung   Dementia Mother    Diabetes Mother    Hypertension Brother    Social History   Socioeconomic History   Marital status: Married    Spouse name: Not on file   Number of children: Not on file   Years of education: Not on file   Highest education level: Associate degree: occupational, Scientist, product/process development, or vocational program  Occupational History   Not on file  Tobacco Use   Smoking status: Never   Smokeless tobacco: Never  Substance and Sexual Activity   Alcohol use: Yes    Alcohol/week: 0.0 standard drinks of alcohol    Comment: 2 times a week   Drug use: No   Sexual activity: Yes  Other Topics Concern   Not on file  Social History Narrative   Not on file   Social Drivers of Health   Financial Resource Strain: Low Risk  (01/27/2024)   Overall Financial Resource Strain (CARDIA)    Difficulty of Paying Living Expenses: Not hard at all  Food Insecurity: No Food Insecurity (01/27/2024)   Hunger Vital Sign    Worried About Running Out of Food in the Last Year: Never true    Ran Out of Food in the Last Year: Never true  Transportation Needs: No Transportation Needs (01/27/2024)   PRAPARE - Administrator, Civil Service (Medical): No    Lack of Transportation (Non-Medical): No  Physical Activity: Sufficiently Active (01/27/2024)   Exercise Vital Sign    Days of Exercise per Week: 7 days    Minutes of Exercise per Session: 40 min  Stress: No Stress Concern Present (01/27/2024)   [employer] of Occupational Health - Occupational Stress Questionnaire    Feeling of Stress: Not at all  Social Connections: Moderately Isolated (01/27/2024)   Social Connection and Isolation Panel    Frequency of Communication with Friends and Family: More than three times a week    Frequency of Social Gatherings with Friends and Family: More than three times a week    Attends Religious Services: Never    Database administrator or Organizations: No    Attends Hospital doctor: Never    Marital Status: Married    Tobacco Counseling Counseling given: Not Answered    Clinical Intake:  Pre-visit preparation completed: Yes  Pain : No/denies pain  Diabetes: No  Lab Results  Component Value Date   HGBA1C 5.4 02/11/2017   HGBA1C 5.7 (H) 04/22/2015   HGBA1C 5.7 (H) 04/24/2014     How often do you need to have someone help you when you read instructions, pamphlets, or other written materials from your doctor or pharmacy?: 1 - Never  Interpreter Needed?: No  Information entered by :: Charmaine Bloodgood LPN   Activities of Daily Living     01/27/2024    8:53 AM  In your present  state of health, do you have any difficulty performing the following activities:  Hearing? 1  Vision? 0  Difficulty concentrating or making decisions? 0  Walking or climbing stairs? 0  Dressing or bathing? 0  Doing errands, shopping? 0  Preparing Food and eating ? N  Using the Toilet? N  In the past six months, have you accidently leaked urine? N  Do you have problems with loss of bowel control? N  Managing your Medications? N  Managing your Finances? N  Housekeeping or managing your Housekeeping? N    Patient Care Team: Alphonsa Glendia LABOR, MD as PCP - General (Family Medicine) Fuchs, Megan K, PA-C (Oncology) Joshua Blamer, MD as Attending Physician (Dermatology)  I have updated your Care Teams any recent Medical Services you may have received from other providers in the past year.     Assessment:   This is a routine wellness examination for Michall.  Hearing/Vision screen Hearing Screening - Comments:: Wears hearing aids  Vision Screening - Comments::  up to date with routine eye exams with [facility])    Goals Addressed             This Visit's Progress    Patient Stated   On track    Stay as healthy as he currently is and spend more time with family        Depression Screen     01/27/2024    8:55 AM 11/09/2023    2:22 PM 12/24/2022    8:36  AM 10/20/2022    3:58 PM 08/06/2022   10:44 AM 04/02/2022    4:50 PM 02/03/2022    9:11 AM  PHQ 2/9 Scores  PHQ - 2 Score 0 0 0 0 0 6 0  PHQ- 9 Score  0  0 1 17 0    Fall Risk     01/27/2024    8:54 AM 11/09/2023    2:22 PM 12/24/2022    8:36 AM 10/20/2022    3:58 PM 08/06/2022   10:43 AM  Fall Risk   Falls in the past year? 0 0 0 0 0  Number falls in past yr: 0  0 0 0  Injury with Fall? 0 0 0 0 0  Risk for fall due to : No Fall Risks  No Fall Risks  No Fall Risks  Follow up Falls prevention discussed;Education provided;Falls evaluation completed  Falls prevention discussed  Falls evaluation completed    MEDICARE RISK AT HOME:  Medicare Risk at Home Any stairs in or around the home?: Yes If so, are there any without handrails?: No Home free of loose throw rugs in walkways, pet beds, electrical cords, etc?: Yes Adequate lighting in your home to reduce risk of falls?: Yes Life alert?: No Use of a cane, walker or w/c?: No Grab bars in the bathroom?: No Shower chair or bench in shower?: No Elevated toilet seat or a handicapped toilet?: No  TIMED UP AND GO:  Was the test performed?  No  Cognitive Function: Declined/Normal: No cognitive concerns noted by patient or family. Patient alert, oriented, able to answer questions appropriately and recall recent events. No signs of memory loss or confusion.        12/24/2022    8:42 AM  6CIT Screen  What Year? 0 points  What month? 0 points  What time? 0 points  Count back from 20 0 points  Months in reverse 0 points  Repeat phrase 0 points  Total Score  0 points    Immunizations Immunization History  Administered Date(s) Administered   DT (Pediatric) 05/14/2013   Influenza,inj,Quad PF,6+ Mos 05/22/2014, 04/22/2015, 03/30/2016, 05/11/2017, 04/11/2018, 03/16/2019, 05/13/2020   Influenza-Unspecified 05/11/2017   PFIZER(Purple Top)SARS-COV-2 Vaccination 10/04/2019, 10/25/2019   PNEUMOCOCCAL CONJUGATE-20 05/16/2023   Tdap 05/11/2017    Zoster Recombinant(Shingrix) 07/31/2018, 03/04/2019    Screening Tests Health Maintenance  Topic Date Due   COVID-19 Vaccine (3 - Pfizer risk series) 11/22/2019   INFLUENZA VACCINE  02/03/2024   Medicare Annual Wellness (AWV)  01/26/2025   DTaP/Tdap/Td (3 - Td or Tdap) 05/12/2027   Colonoscopy  05/15/2030   Pneumococcal Vaccine: 50+ Years  Completed   Hepatitis C Screening  Completed   Zoster Vaccines- Shingrix  Completed   Hepatitis B Vaccines  Aged Out   HPV VACCINES  Aged Out   Meningococcal B Vaccine  Aged Out    Health Maintenance  Health Maintenance Due  Topic Date Due   COVID-19 Vaccine (3 - Pfizer risk series) 11/22/2019    Additional Screening:  Vision Screening: Recommended annual ophthalmology exams for early detection of glaucoma and other disorders of the eye. Would you like a referral to an eye doctor? No    Dental Screening: Recommended annual dental exams for proper oral hygiene  Community Resource Referral / Chronic Care Management: CRR required this visit?  No   CCM required this visit?  No   Plan:    I have personally reviewed and noted the following in the patient's chart:   Medical and social history Use of alcohol, tobacco or illicit drugs  Current medications and supplements including opioid prescriptions. Patient is not currently taking opioid prescriptions. Functional ability and status Nutritional status Physical activity Advanced directives List of other physicians Hospitalizations, surgeries, and ER visits in previous 12 months Vitals Screenings to include cognitive, depression, and falls Referrals and appointments  In addition, I have reviewed and discussed with patient certain preventive protocols, quality metrics, and best practice recommendations. A written personalized care plan for preventive services as well as general preventive health recommendations were provided to patient.   Lavelle Pfeiffer Scissors, [place]   2/74/7974    After Visit Summary: (MyChart) Due to this being a telephonic visit, the after visit summary with patients personalized plan was offered to patient via MyChart   Notes: PCP Follow Up Recommendations: Patient reported that he increased exercise but blood pressure is still staying elevated with diastolic sometimes in the 90s.  He will continue to monitor and if it becomes more elevated he will contact office before next scheduled appointment

## 2024-01-27 NOTE — Patient Instructions (Signed)
 Mr. John Marquez , Thank you for taking time out of your busy schedule to complete your Annual Wellness Visit with me. I enjoyed our conversation and look forward to speaking with you again next year. I, as well as your care team,  appreciate your ongoing commitment to your health goals. Please review the following plan we discussed and let me know if I can assist you in the future. Your Game plan/ To Do List     Follow up Visits: Next Medicare AWV with our clinical staff: In 1 year    Have you seen your provider in the last 6 months (3 months if uncontrolled diabetes)? Yes Next Office Visit with your provider: 03/08/24 @ 1:10  Clinician Recommendations:  Aim for 30 minutes of exercise or brisk walking, 6-8 glasses of water , and 5 servings of fruits and vegetables each day.       This is a list of the screening recommended for you and due dates:  Health Maintenance  Topic Date Due   COVID-19 Vaccine (3 - Pfizer risk series) 11/22/2019   Flu Shot  02/03/2024   Medicare Annual Wellness Visit  01/26/2025   DTaP/Tdap/Td vaccine (3 - Td or Tdap) 05/12/2027   Colon Cancer Screening  05/15/2030   Pneumococcal Vaccine for age over 3  Completed   Hepatitis C Screening  Completed   Zoster (Shingles) Vaccine  Completed   Hepatitis B Vaccine  Aged Out   HPV Vaccine  Aged Out   Meningitis B Vaccine  Aged Out    Advanced directives: (ACP Link)Information on Advanced Care Planning can be found at [place]  Best boy Advance Health Care Directives Advance Health Care Directives. [email or web address]   Advance Care Planning is important because it:  [x]  Makes sure you receive the medical care that is consistent with your values, goals, and preferences  [x]  It provides guidance to your family and loved ones and reduces their decisional burden about whether or not they are making the right decisions based on your wishes.  Follow the link provided in your after visit summary or read over the paperwork we  have mailed to you to help you started getting your Advance Directives in place. If you need assistance in completing these, please reach out to us  so that we can help you!  See attachments for Preventive Care and Fall Prevention Tips.

## 2024-02-03 ENCOUNTER — Other Ambulatory Visit: Payer: Self-pay | Admitting: Family Medicine

## 2024-03-06 ENCOUNTER — Other Ambulatory Visit: Payer: Self-pay | Admitting: Family Medicine

## 2024-03-08 ENCOUNTER — Ambulatory Visit: Admitting: Family Medicine

## 2024-03-08 ENCOUNTER — Encounter: Payer: Self-pay | Admitting: Family Medicine

## 2024-03-08 VITALS — BP 142/81 | HR 84 | Temp 98.1°F | Wt 220.0 lb

## 2024-03-08 DIAGNOSIS — R030 Elevated blood-pressure reading, without diagnosis of hypertension: Secondary | ICD-10-CM

## 2024-03-08 MED ORDER — TADALAFIL 20 MG PO TABS: 10.0000 mg | ORAL_TABLET | ORAL | 11 refills | Status: AC | PRN

## 2024-03-08 MED ORDER — DULOXETINE HCL 60 MG PO CPEP: 60.0000 mg | ORAL_CAPSULE | Freq: Every day | ORAL | 2 refills | Status: AC

## 2024-03-08 NOTE — Progress Notes (Signed)
   Subjective:    Patient ID: John Marquez, male    DOB: 1956-05-24, 68 y.o.   MRN: [id number]  HPI Patient overall doing well Moods are doing well Energy level overall doing well He is trying the best he can and getting his blood pressure under better control He denies any type of chest tightness pressure pain or shortness of breath He does minimize salt Stays physically active Uses Ambien  at nighttime to help rest   Review of Systems     Objective:   Physical Exam General-in no acute distress Eyes-no discharge Lungs-respiratory rate normal, CTA CV-no murmurs,RRR Extremities skin warm dry no edema Neuro grossly normal Behavior normal, alert  No need to do additional blood work now but when he does his wellness in the spring he will need additional lab work      Assessment & Plan:   Elevated blood pressure Borderline currently He is gena work really hard on dietary measures as well as cardio exercise and he will follow-up again in 3 months time if it is not looking good we will need to initiate additional measures

## 2024-04-27 ENCOUNTER — Encounter: Payer: Self-pay | Admitting: Family Medicine

## 2024-04-30 ENCOUNTER — Other Ambulatory Visit: Payer: Self-pay | Admitting: Family Medicine

## 2024-04-30 MED ORDER — AMLODIPINE BESYLATE 5 MG PO TABS: 5.0000 mg | ORAL_TABLET | Freq: Every day | ORAL | 1 refills | Status: AC

## 2024-04-30 NOTE — Telephone Encounter (Signed)
 Nursing staff  I sent in his medication amlodipine 5 mg 1 daily to [facility] He can check his blood pressure a few times a week and send us  some updates within the next 2 to 3 weeks Notify us  sooner if any concerns or issues  Please also have the front/bottom work with patient to schedule him a follow-up office visit in approximately 6 weeks with myself thank you

## 2024-05-02 NOTE — Telephone Encounter (Signed)
 Please contact patient to schedule per Dr. Alphonsa  Thank you!

## 2024-05-22 ENCOUNTER — Encounter: Payer: Self-pay | Admitting: Family Medicine

## 2024-05-22 ENCOUNTER — Ambulatory Visit: Admitting: Family Medicine

## 2024-05-22 VITALS — BP 130/73 | HR 75 | Temp 97.9°F | Ht 73.0 in | Wt 222.0 lb

## 2024-05-22 DIAGNOSIS — I10 Essential (primary) hypertension: Secondary | ICD-10-CM

## 2024-05-22 NOTE — Progress Notes (Signed)
   Subjective:    Patient ID: John Marquez, male    DOB: Jul 08, 1955, 68 y.o.   MRN: [id number] BP f/u. Doing well on Amlodipine.  HPI Patient taking his amlodipine 5 mg see previous note see previous message Blood pressure been doing well since then Denies any chest tightness pressure pain shortness of breath States overall energy level doing okay No side effects of the medicine    Review of Systems     Objective:   Physical Exam Is clear respiratory rate normal heart is regular pulse normal BP is good on recheck       Assessment & Plan:  HTN-good control continue current measures.  No need to add any additional measures at this time Healthy diet recommended Continue amlodipine 5 mg Follow-up for wellness in May notify us  right before that visit for lab work

## 2024-06-18 ENCOUNTER — Ambulatory Visit: Admitting: Family Medicine

## 2024-06-18 ENCOUNTER — Other Ambulatory Visit: Payer: Self-pay | Admitting: Family Medicine

## 2024-06-18 VITALS — BP 130/70 | Ht 73.0 in | Wt 226.8 lb

## 2024-06-18 DIAGNOSIS — Z23 Encounter for immunization: Secondary | ICD-10-CM | POA: Diagnosis not present

## 2024-06-18 DIAGNOSIS — Z8572 Personal history of non-Hodgkin lymphomas: Secondary | ICD-10-CM

## 2024-06-18 DIAGNOSIS — Z125 Encounter for screening for malignant neoplasm of prostate: Secondary | ICD-10-CM

## 2024-06-18 DIAGNOSIS — I10 Essential (primary) hypertension: Secondary | ICD-10-CM | POA: Diagnosis not present

## 2024-06-18 DIAGNOSIS — Z79899 Other long term (current) drug therapy: Secondary | ICD-10-CM

## 2024-06-18 DIAGNOSIS — Z13220 Encounter for screening for lipoid disorders: Secondary | ICD-10-CM

## 2024-06-18 DIAGNOSIS — G620 Drug-induced polyneuropathy: Secondary | ICD-10-CM

## 2024-06-18 MED ORDER — ZOLPIDEM TARTRATE 10 MG PO TABS: ORAL_TABLET | ORAL | 3 refills | Status: AC

## 2024-06-18 NOTE — Progress Notes (Signed)
° °  Subjective:    Patient ID: John Marquez, male    DOB: [birth year], 68 y.o.   MRN: [id number]  HPI Very nice patient Doing well with taking his medicine Stays physically active Exercises Healthy diet Takes his medicines as directed Doing well overall Not having any setbacks or problems   Review of Systems     Objective:   Physical Exam General-in no acute distress Eyes-no discharge Lungs-respiratory rate normal, CTA CV-no murmurs,RRR Extremities skin warm dry no edema Neuro grossly normal Behavior normal, alert        Assessment & Plan:  HTN- patient seen for follow-up regarding HTN.   Diet, medication compliance, appropriate labs and refills were completed.   Importance of keeping blood pressure under good control to lessen the risk of complications discussed Regular follow-up visits discussed  Patient to do follow-up in 6 months

## 2024-11-15 ENCOUNTER — Encounter: Admitting: Family Medicine

## 2024-12-17 ENCOUNTER — Ambulatory Visit: Admitting: Family Medicine

## 2025-02-01 ENCOUNTER — Ambulatory Visit
